# Patient Record
Sex: Female | Born: 1975 | Race: Black or African American | Hispanic: No | Marital: Single | Smoking: Never smoker
Health system: Southern US, Community
[De-identification: ages and names within clinical notes are randomized; demographics above are authoritative.]

## PROBLEM LIST (undated history)

## (undated) DIAGNOSIS — E559 Vitamin D deficiency, unspecified: Secondary | ICD-10-CM

## (undated) DIAGNOSIS — L509 Urticaria, unspecified: Secondary | ICD-10-CM

## (undated) DIAGNOSIS — Z5189 Encounter for other specified aftercare: Secondary | ICD-10-CM

## (undated) DIAGNOSIS — T7840XA Allergy, unspecified, initial encounter: Secondary | ICD-10-CM

## (undated) DIAGNOSIS — E669 Obesity, unspecified: Secondary | ICD-10-CM

## (undated) HISTORY — DX: Obesity, unspecified: E66.9

## (undated) HISTORY — DX: Vitamin D deficiency, unspecified: E55.9

## (undated) HISTORY — DX: Allergy, unspecified, initial encounter: T78.40XA

---

## 2001-07-06 ENCOUNTER — Ambulatory Visit (HOSPITAL_COMMUNITY): Admission: RE | Admit: 2001-07-06 | Discharge: 2001-07-06 | Payer: Self-pay | Admitting: *Deleted

## 2001-10-15 ENCOUNTER — Ambulatory Visit (HOSPITAL_COMMUNITY): Admission: RE | Admit: 2001-10-15 | Discharge: 2001-10-15 | Payer: Self-pay | Admitting: Obstetrics & Gynecology

## 2001-10-22 ENCOUNTER — Inpatient Hospital Stay (HOSPITAL_COMMUNITY): Admission: AD | Admit: 2001-10-22 | Discharge: 2001-10-22 | Payer: Self-pay | Admitting: *Deleted

## 2001-10-23 ENCOUNTER — Inpatient Hospital Stay (HOSPITAL_COMMUNITY): Admission: AD | Admit: 2001-10-23 | Discharge: 2001-10-23 | Payer: Self-pay | Admitting: *Deleted

## 2001-12-17 ENCOUNTER — Encounter (HOSPITAL_COMMUNITY): Admission: RE | Admit: 2001-12-17 | Discharge: 2001-12-25 | Payer: Self-pay | Admitting: Obstetrics and Gynecology

## 2001-12-27 ENCOUNTER — Encounter (INDEPENDENT_AMBULATORY_CARE_PROVIDER_SITE_OTHER): Payer: Self-pay | Admitting: Specialist

## 2001-12-27 ENCOUNTER — Inpatient Hospital Stay (HOSPITAL_COMMUNITY): Admission: AD | Admit: 2001-12-27 | Discharge: 2002-01-02 | Payer: Self-pay | Admitting: *Deleted

## 2002-01-06 ENCOUNTER — Inpatient Hospital Stay (HOSPITAL_COMMUNITY): Admission: AD | Admit: 2002-01-06 | Discharge: 2002-01-06 | Payer: Self-pay | Admitting: *Deleted

## 2002-01-17 ENCOUNTER — Inpatient Hospital Stay (HOSPITAL_COMMUNITY): Admission: AD | Admit: 2002-01-17 | Discharge: 2002-01-17 | Payer: Self-pay | Admitting: *Deleted

## 2004-01-08 ENCOUNTER — Emergency Department (HOSPITAL_COMMUNITY): Admission: EM | Admit: 2004-01-08 | Discharge: 2004-01-08 | Payer: Self-pay | Admitting: Emergency Medicine

## 2005-01-26 ENCOUNTER — Encounter: Admission: RE | Admit: 2005-01-26 | Discharge: 2005-01-26 | Payer: Self-pay | Admitting: *Deleted

## 2006-08-07 ENCOUNTER — Ambulatory Visit: Payer: Self-pay | Admitting: Internal Medicine

## 2006-08-28 ENCOUNTER — Ambulatory Visit: Payer: Self-pay | Admitting: Internal Medicine

## 2007-02-21 ENCOUNTER — Ambulatory Visit: Payer: Self-pay | Admitting: Family Medicine

## 2007-08-07 ENCOUNTER — Encounter (INDEPENDENT_AMBULATORY_CARE_PROVIDER_SITE_OTHER): Payer: Self-pay | Admitting: Family Medicine

## 2007-08-07 ENCOUNTER — Ambulatory Visit: Payer: Self-pay | Admitting: Nurse Practitioner

## 2007-08-07 DIAGNOSIS — N760 Acute vaginitis: Secondary | ICD-10-CM | POA: Insufficient documentation

## 2007-09-24 ENCOUNTER — Encounter (INDEPENDENT_AMBULATORY_CARE_PROVIDER_SITE_OTHER): Payer: Self-pay | Admitting: Family Medicine

## 2009-05-25 ENCOUNTER — Emergency Department (HOSPITAL_COMMUNITY): Admission: EM | Admit: 2009-05-25 | Discharge: 2009-05-25 | Payer: Self-pay | Admitting: Emergency Medicine

## 2009-07-22 ENCOUNTER — Ambulatory Visit (HOSPITAL_COMMUNITY): Admission: RE | Admit: 2009-07-22 | Discharge: 2009-07-22 | Payer: Self-pay | Admitting: Obstetrics & Gynecology

## 2009-09-15 ENCOUNTER — Ambulatory Visit (HOSPITAL_COMMUNITY): Admission: RE | Admit: 2009-09-15 | Discharge: 2009-09-15 | Payer: Self-pay | Admitting: Obstetrics and Gynecology

## 2009-10-13 ENCOUNTER — Ambulatory Visit (HOSPITAL_COMMUNITY): Admission: RE | Admit: 2009-10-13 | Discharge: 2009-10-13 | Payer: Self-pay | Admitting: Obstetrics and Gynecology

## 2009-11-10 ENCOUNTER — Ambulatory Visit (HOSPITAL_COMMUNITY): Admission: RE | Admit: 2009-11-10 | Discharge: 2009-11-10 | Payer: Self-pay | Admitting: Obstetrics and Gynecology

## 2009-11-14 ENCOUNTER — Inpatient Hospital Stay (HOSPITAL_COMMUNITY): Admission: AD | Admit: 2009-11-14 | Discharge: 2009-11-14 | Payer: Self-pay | Admitting: Obstetrics & Gynecology

## 2009-12-01 ENCOUNTER — Ambulatory Visit (HOSPITAL_COMMUNITY): Admission: RE | Admit: 2009-12-01 | Discharge: 2009-12-01 | Payer: Self-pay | Admitting: Obstetrics and Gynecology

## 2009-12-16 ENCOUNTER — Inpatient Hospital Stay (HOSPITAL_COMMUNITY): Admission: RE | Admit: 2009-12-16 | Discharge: 2009-12-18 | Payer: Self-pay | Admitting: Obstetrics and Gynecology

## 2010-03-18 IMAGING — US US OB FOLLOW-UP
1 series · 14 of 28 positions shown · non-contrast
Comparison: none

OBSTETRICAL ULTRASOUND:
 This ultrasound was performed in The [HOSPITAL], and the [facility] report will be stored to [REDACTED] PACS.  This report is also available in [HOSPITAL]?s accessANYware.

[Series 1: us ob follow-up · 14 of 37 slices shown]
[im 2/37]
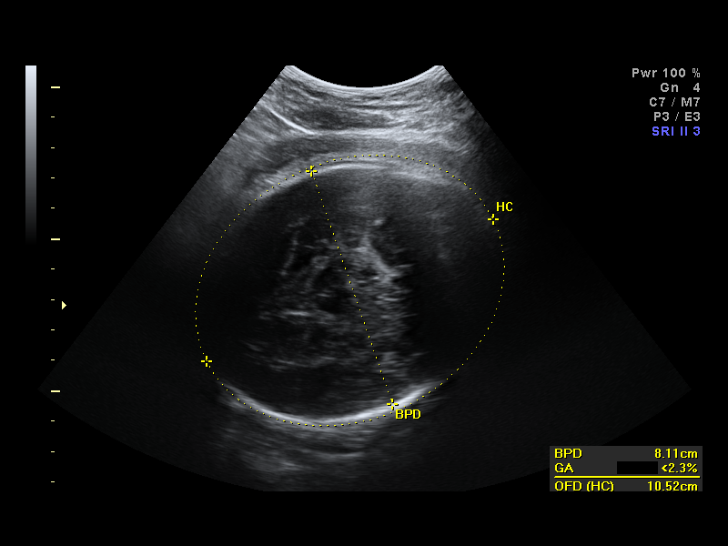
[im 5/37]
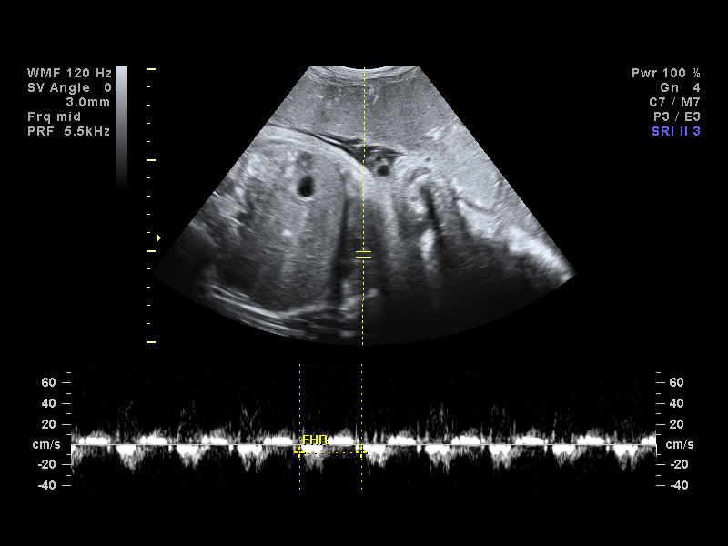
[im 7/37]
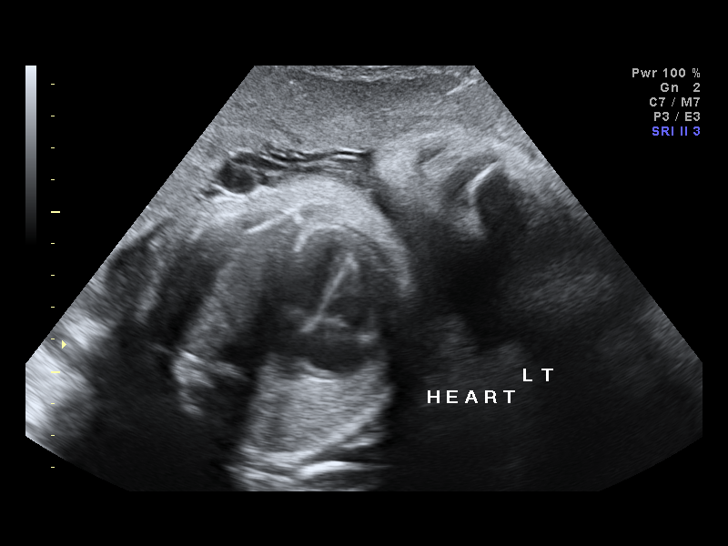
[im 10/37]
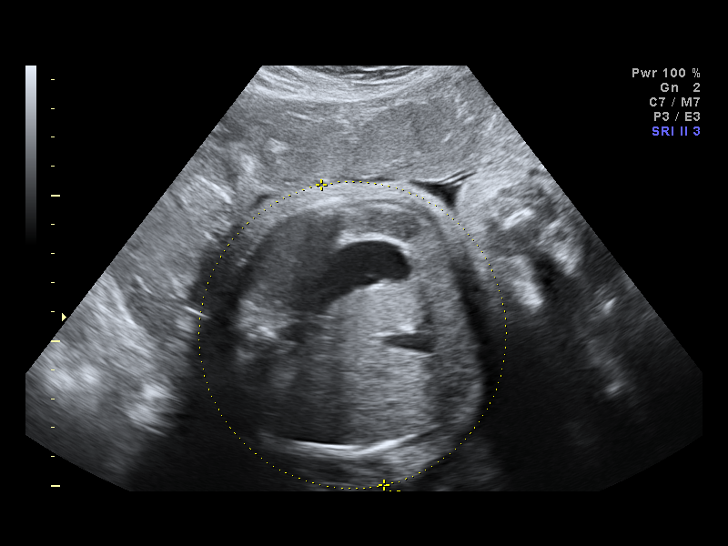
[im 13/37]
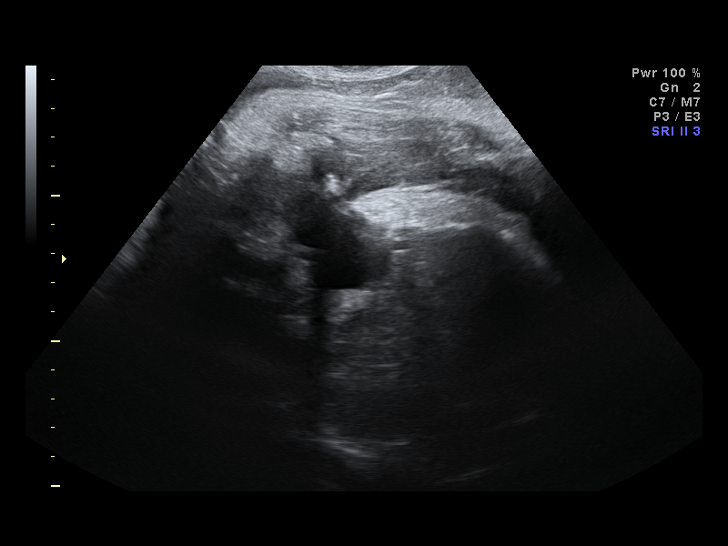
[im 15/37]
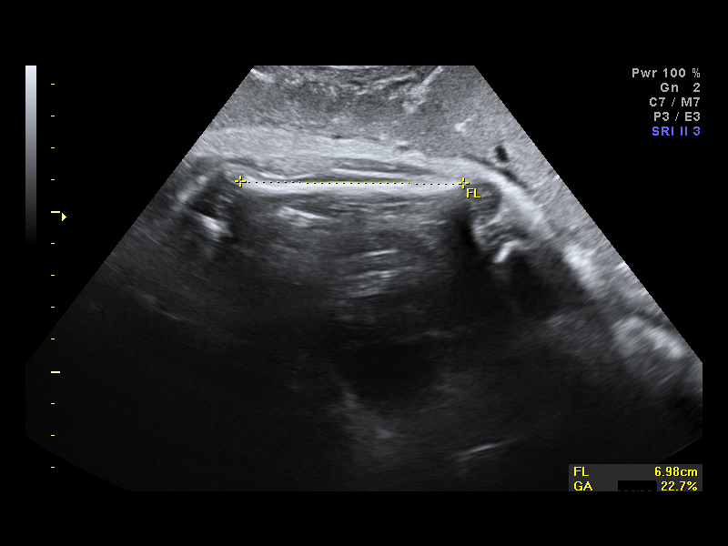
[im 18/37]
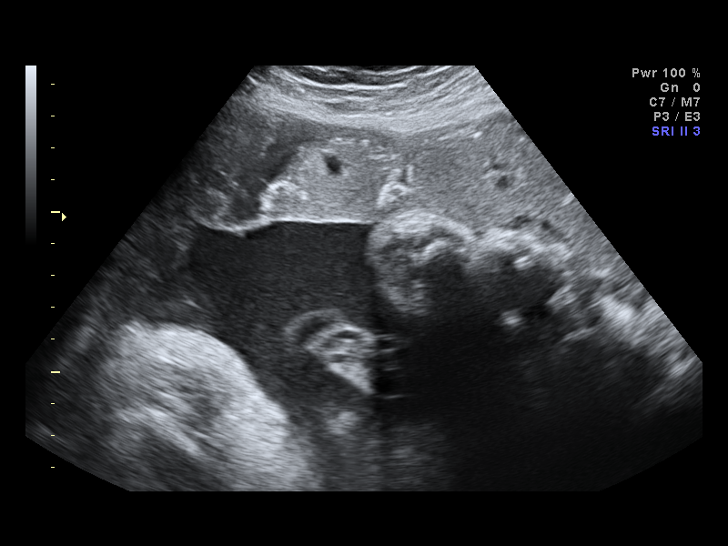
[im 21/37]
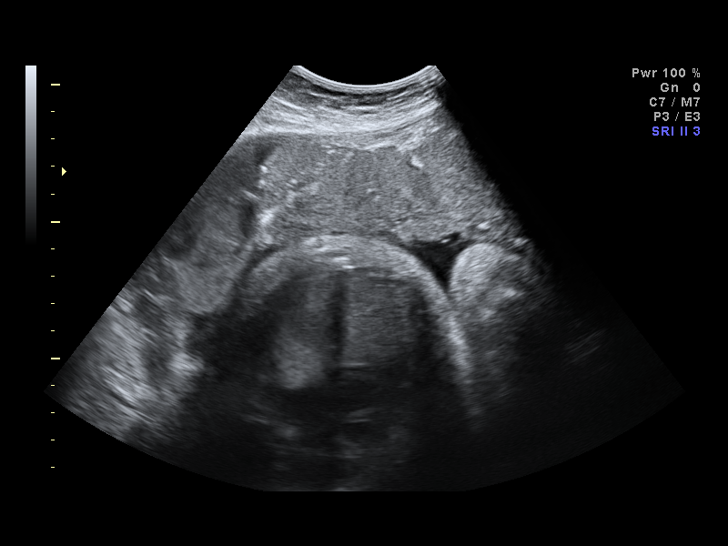
[im 23/37]
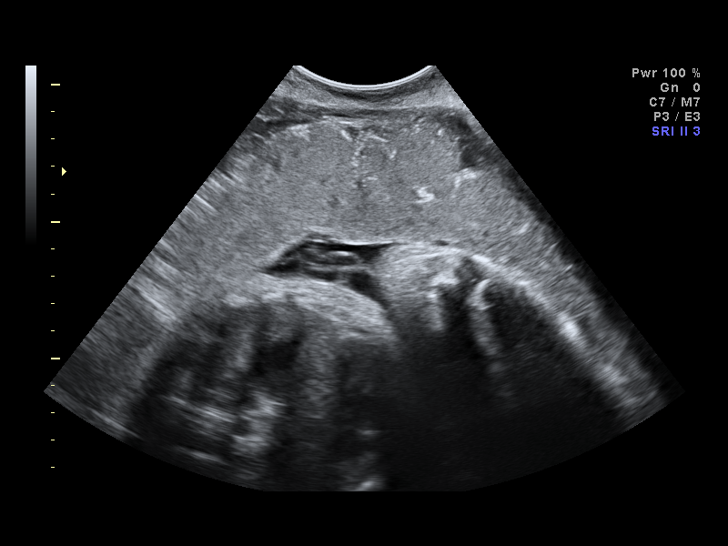
[im 26/37]
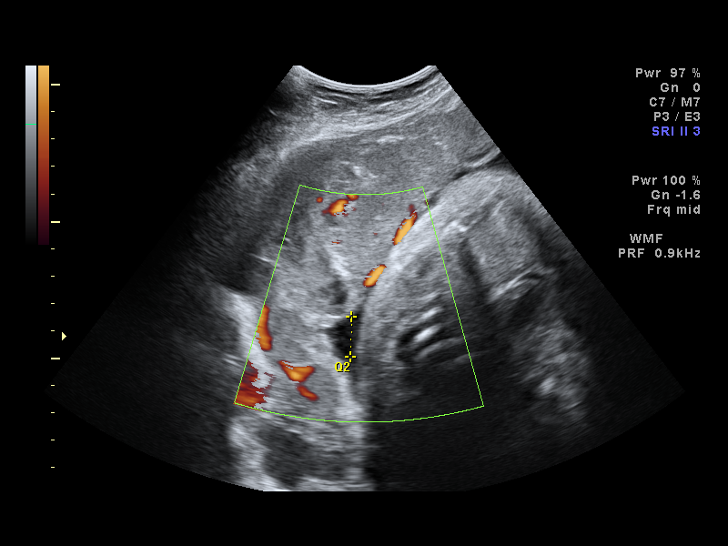
[im 29/37]
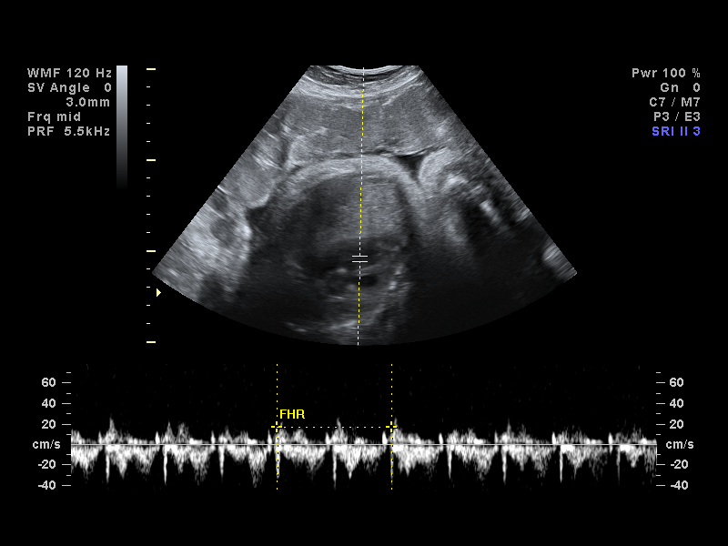
[im 31/37]
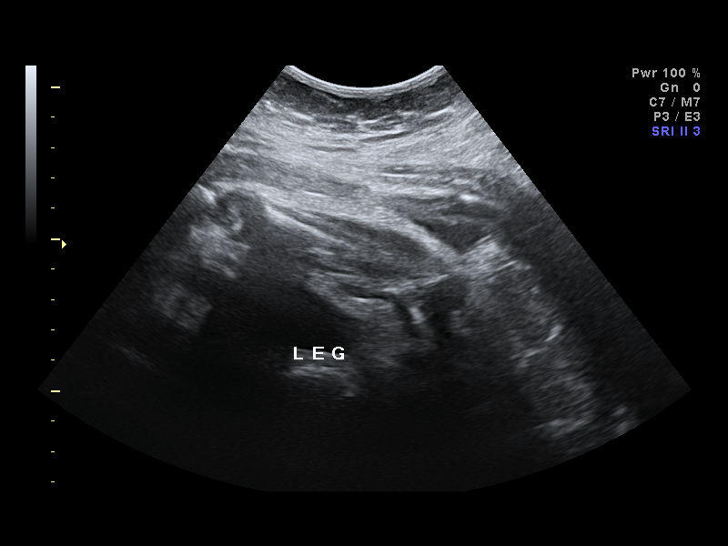
[im 34/37]
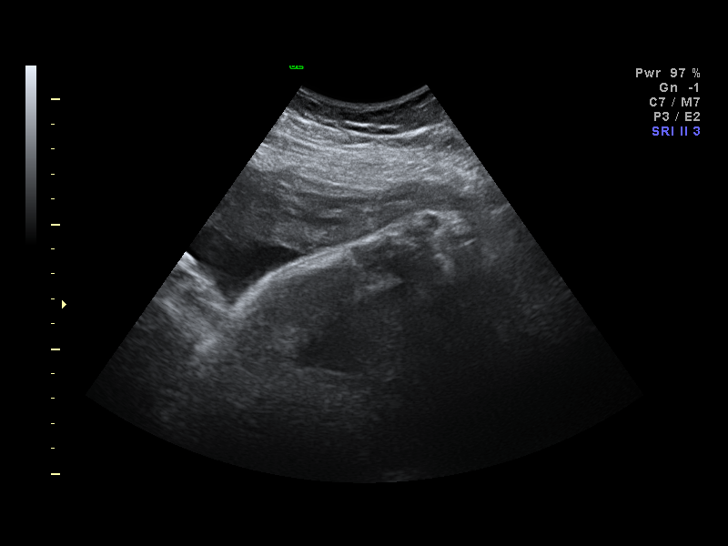
[im 37/37]
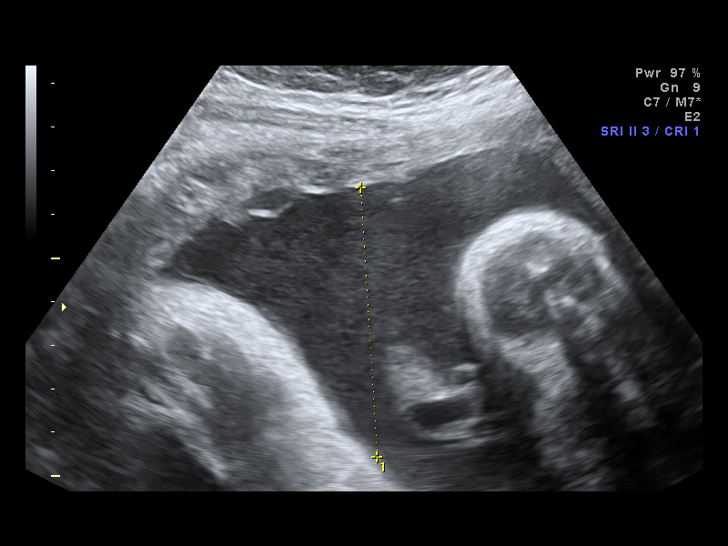

[14 of 28 positions shown; findings below may reference images not displayed]

IMPRESSION: [facility] has also been faxed to the ordering physician.

## 2010-10-17 ENCOUNTER — Encounter: Payer: Self-pay | Admitting: Obstetrics and Gynecology

## 2010-10-28 NOTE — Medication Information (Signed)
Summary: SCRIPT WROTE  SCRIPT WROTE   Imported By: Arta Bruce 09/24/2007 10:53:14  _____________________________________________________________________  External Attachment:    Type:   Image     Comment:   External Document

## 2010-12-19 LAB — CBC
HCT: 24.2 % — ABNORMAL LOW (ref 36.0–46.0)
HCT: 34 % — ABNORMAL LOW (ref 36.0–46.0)
Hemoglobin: 11.3 g/dL — ABNORMAL LOW (ref 12.0–15.0)
Hemoglobin: 8.1 g/dL — ABNORMAL LOW (ref 12.0–15.0)
MCHC: 33.2 g/dL (ref 30.0–36.0)
MCHC: 33.5 g/dL (ref 30.0–36.0)
MCV: 93.3 fL (ref 78.0–100.0)
MCV: 94.4 fL (ref 78.0–100.0)
Platelets: 172 10*3/uL (ref 150–400)
Platelets: 231 10*3/uL (ref 150–400)
RBC: 2.56 MIL/uL — ABNORMAL LOW (ref 3.87–5.11)
RBC: 3.65 MIL/uL — ABNORMAL LOW (ref 3.87–5.11)
RDW: 15.3 % (ref 11.5–15.5)
RDW: 15.4 % (ref 11.5–15.5)
WBC: 6.1 10*3/uL (ref 4.0–10.5)
WBC: 7.9 10*3/uL (ref 4.0–10.5)

## 2010-12-19 LAB — RPR: RPR Ser Ql: NONREACTIVE

## 2011-02-11 NOTE — Discharge Summary (Signed)
[facility]  Patient:    Valerie Rivera, Valerie Rivera Visit Number: [id number] MRN: [id number]          Service Type: OBS Location: 910A 9146 01 Attending Physician:  Enid Cutter Dictated by:   Ed Blalock. Burnadette Peter, M.D. Admit Date:  12/27/2001 Discharge Date: 01/02/2002   CC:         GYN Clinic  [facility]   Discharge Summary  DATE OF BIRTH:  1976-01-13  HISTORY OF PRESENT ILLNESS:  This 35 year old, G1, presented at 41-6/7 weeks by last menstrual period confirmed by 17 week ultrasound for induction of labor for post-dates.  On presentation the patient was having some mild contractions, no vaginal bleeding, membranes were intact, and good fetal movement.  Prenatal care was at [facility].  She had a history of BV, and a positive drug screen for marijuana during the pregnancy, as well as chlamydia.  MEDICATIONS:  None.  ALLERGIES:  No known drug allergies.  PAST OBSTETRICAL HISTORY:  She is a primigravida.  PAST GYNECOLOGICAL HISTORY:  History of positive chlamydia test and BV.  PAST MEDICAL HISTORY:  She had a history of cystitis x2 in 1999 and 2001.  PAST SURGICAL HISTORY:  Denies any.  FAMILY HISTORY:  The patient is adopted and with limited knowledge of her biological parents.  SOCIAL HISTORY:  Denies current tobacco or illicit drug use or alcohol use.  PRENATAL LABORATORY DATA:  Normal.  PHYSICAL EXAMINATION:  VITAL SIGNS:  Stable at presentation.  GENERAL:  In no acute distress.  HEART:  Regular rate and rhythm, no murmur.  LUNGS:  Clear to auscultation bilaterally.  ABDOMEN:  Soft and gravid.  EXTREMITIES:  Edema 1+.  NEUROLOGIC:  Deep tendon reflexes 2+, no clonus.  PELVIC:  Speculum examination on presentation was not done.  Digital cervical examination revealed cervix 3 cm, 80%, and -1.  Intact membranes, and vertex presentation.  Fetal heart rate was 130 to 145 baseline, good variability and reactive, no  decelerations.  She was contracting every 5 minutes mildly.  HOSPITAL COURSE:  The patient was admitted to labor and delivery and underwent induction of labor with Pitocin for her post-dates.  She had an epidural placed.  The patient made little progress, and therefore her Pitocin was increased after artificial rupture of membranes was undertaken.  The Pitocin reached a max dose of 30 ______.  The patient after being on Pitocin overnight with IUPC being placed and monitoring of her daily units that were at times inadequate and at times adequate, the patient reached maximum dilation of 7 cm, and was taken for a cesarean section for failed induction secondary to failure to progress.  During cesarean section, the patient was delivered of a viable female infant, Apgars 8 and 9 at one and five minutes with a weight of 8 pounds 2 ounces, and a cord pH of 7.38.  The patient also had postpartum hemorrhage due to severe uterine atony requiring Hemabate x3 with two of those being intramuscularly and one being in the myometrium, Pitocin 10 units x2 in the myometrium, and Methergine 0.2 x2 intramuscularly.  Her bleeding stabilized after this.  She required immediate transfusion of 2 units of packed red blood cells and a subsequent transfusion postoperatively of 1 unit of packed red blood cells.  The patient also developed ______ result of her atony and hemorrhage DIC, requiring transfusion of 2 units of packed red blood cells.  Her labs were followed serially, and her hemoglobin as well as  her DIC resolved.  She was discharged home postoperative day #4 without any further complications.  During her hospitalization her maximum hemoglobin was 12.1, at discharge it was 7.7.  Her maximum INR was 1.7, normalizing at 1.2. Her fibrinogen at its lowest was 232.  On 12/30/01, it was 550, back in the high-normal range which is normal for pregnancy.  Her D-dimer at the highest was greater than 20, decreasing to 7.58  at last check.  She also had elevated liver function tests, AST of 53, ALT of 24, which normalized.  Due to her large blood loss and a few skipped beats on the monitor, the CK was elevated, the MB and index were normal.  The troponin was equivocal, and the EKG was negative.  Troponin was repeated prior to discharge and was negative.  Her EKG was normal.  ACTIVITY:  Ad lib and pelvic rest.  DIET:  Regular.  DISCHARGE MEDICATIONS: 1. Percocet 5/325 mg one p.o. q.4h. p.r.n. 2. Motrin 800 mg one p.o. q.8h. p.r.n. 3. Iron 325 mg one p.o. b.i.d. 4. Colace 100 mg p.o. b.i.d. 5. Prenatal vitamins one p.o. q.d.  FOLLOWUP: 1. Return to Maternity Admissions in 3 days for staple removal. 2. Return to GYN Clinic in two weeks for followup on hemoglobin and D-dimer. 3. Return to [facility] in six weeks for routine followup.  CONDITION ON DISCHARGE:  Improved and delivered.  DISCHARGE DIAGNOSES: 1. Intrauterine pregnancy at post-dates. 2. Induction of labor. 3. Failed induction secondary to failure to progress, maximum dilation 7 cm. 4. Status post low transverse cesarean section. 5. Postpartum hemorrhage secondary to uterine atony. 6. Desceminated intravascular coagulopathy (DIC), resolved. 7. Anemia secondary to postpartum hemorrhage. Dictated by:   Ed Blalock. Burnadette Peter, M.D. Attending Physician:  Enid Cutter DD:  01/02/02 TD:  01/03/02 Job: [id number]

## 2011-02-11 NOTE — Op Note (Signed)
[facility]  Patient:    Valerie Rivera, Valerie Rivera Visit Number: [id number] MRN: [id number]          Service Type: OBS Location: 9400 [id number] 01 Attending Physician:  Enid Cutter Dictated by:   Ed Blalock. Burnadette Peter, M.D. Proc. Date: 12/29/01 Admit Date:  12/27/2001                             Operative Report  DATE OF BIRTH:                [birth year]  PREOPERATIVE DIAGNOSES:       1. Intrauterine pregnancy at 34 and 2.                               2. Induction of labor secondary to post dates.                               3. Failed induction secondary to failure to                                  progress.  POSTOPERATIVE DIAGNOSES:      1. Intrauterine pregnancy at 34 and 2.                               2. Induction of labor secondary to post dates.                               3. Failed induction secondary to failure to                                  progress.                               4. Uterine atone intraoperatively.  PROCEDURE:                    Primary low transverse cesarean section.  SURGEON:                      Conni Elliot, M.D.  ASSISTANT:                    Ed Blalock. Burnadette Peter, M.D.  ESTIMATED BLOOD LOSS:         3300 cc.  FLUIDS:                       4200 cc lactated Ringers.  URINE OUTPUT:                 900 cc.  INDICATIONS:                  A 35 year old G1 at 34 and 2 who underwent induction of labor secondary to post dates with the subsequence of failure to progress with a maximum dilation of 7 cm overnight.  FINDINGS:  Female infant.  Weight 8 pounds 2 ounces.  pH 7.38.  Apgars 8 and 9.  COMPLICATIONS:                Uterine atony and postpartum hemorrhage of approximately 3300 L.  The atony required Hemabate x2 intramuscularly, x1 in the myometrium, Pitocin 10 units x2 in the myometrium, and Methergine 0.2 mg x2 intramuscularly.  PROCEDURE:                    Patient was taken to the  operating room where epidural anesthesia was found to be adequate.  She was prepped and draped in the normal sterile fashion in the dorsal supine position.  Pfannenstiel incision was then made with the scalpel and carried through to the underlying layer of fascia sharply.  The fascia was incised in the midline.  The incision was extended laterally with Mayo scissors.  Superior aspect of the fascial incision was then grasped with the Kocher clamps, elevated and the underlying rectus muscle dissected off bluntly.  Attention was then turned to the inferior aspect of the incision which in a similar fashion was grasped, tented up with the clamps, and the rectus muscles dissected off bluntly.  The rectus muscles were then separated in the midline.  Peritoneum identified, tented up, and entered sharply with Metzenbaum scissors.  The peritoneal incision was extended superiorly and inferiorly with good visualization of the bladder. Bladder blade was then inserted and the vesicouterine peritoneum was identified, grasped with pickups, and entered sharply with Metzenbaum scissors.  This incision was then extended laterally with Metzenbaum and the bladder flap was created digitally.  The bladder blade was then reinserted and the lower uterine segment was incised in a transverse fashion with the scalpel.  The uterine incision was then extended laterally with stretching. Bladder blade was moved and the patients head delivered atraumatically.  The nose and mouth were suctioned with the bulb suction and the cord clamped and cut.  The infant was handed off to the awaiting pediatrician.  Cord gases were sent.  Results as above.  The placenta was then removed by expressing it.  The uterus exteriorized and cleared of all clots and debris.  The uterine incision was repaired with 1-0 chromic in a running locked fashion.  At this time atony of the uterus was noted and the interventions as noted above were  performed.  In addition, the patient received fluid boluses and Ephedra intramuscularly.  Patient was also typed and crossed for 2 units of packed red blood cells stat and transfusion begun in the operating room.  After these interventions it was felt that the uterus was firming up.  A second layer of suture was used to obtain good hemostasis.  Bladder flap was repaired with 3-0 Vicryl in a running stitch. Uterus returned to the abdomen.  The gutter was cleared of clots.  The peritoneum was closed with 0 Vicryl.  The fascia was reapproximated with 0 Vicryl in a running fashion.  Skin was closed with staples.  Prior to removal from the operating room the patient had increased vaginal bleeding.  The uterus was explored from below removing several large clots from the lower uterine segment.  The fundus was firm.  The uterine segment subsequently became firm.  Her bleeding decreased.  The patient tolerated the procedure well and remained alert and mentating. Sponge, lap, and needle counts were correct x2.  Patient was continued on Unasyn postoperatively.  She was taken to the recovery  room in stable condition.  In the recovery room a DIC panel was ordered.  Hemabate 0.25 mg standard dose was ordered again as well as Methergine 0.2 mg IM q.4h. and a post transfusion H&H. Dictated by:   Ed Blalock. Burnadette Peter, M.D. Attending Physician:  Enid Cutter DD:  12/29/01 TD:  12/30/01 Job: [id number]

## 2011-09-28 ENCOUNTER — Encounter: Payer: Self-pay | Admitting: *Deleted

## 2011-09-28 ENCOUNTER — Emergency Department (HOSPITAL_COMMUNITY)
Admission: EM | Admit: 2011-09-28 | Discharge: 2011-09-28 | Disposition: A | Discharge status: Home / Self Care | Payer: No Typology Code available for payment source | Attending: Emergency Medicine | Admitting: Emergency Medicine

## 2011-09-28 DIAGNOSIS — T148XXA Other injury of unspecified body region, initial encounter: Secondary | ICD-10-CM | POA: Insufficient documentation

## 2011-09-28 DIAGNOSIS — M545 Low back pain, unspecified: Secondary | ICD-10-CM | POA: Insufficient documentation

## 2011-09-28 DIAGNOSIS — Y92410 Unspecified street and highway as the place of occurrence of the external cause: Secondary | ICD-10-CM | POA: Insufficient documentation

## 2011-09-28 MED ORDER — METHOCARBAMOL 500 MG PO TABS: ORAL_TABLET | ORAL | Status: AC

## 2011-09-28 MED ORDER — IBUPROFEN 600 MG PO TABS: 600.0000 mg | ORAL_TABLET | Freq: Four times a day (QID) | ORAL | Status: AC | PRN

## 2011-09-28 MED ORDER — IBUPROFEN 200 MG PO TABS
600.0000 mg | ORAL_TABLET | Freq: Once | ORAL | Status: AC
  Administered 2011-09-28: 600 mg via ORAL
  Filled 2011-09-28: qty 3
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
## 2011-09-28 NOTE — ED Notes (Signed)
MD @ bedside to eval

## 2011-09-28 NOTE — ED Notes (Signed)
Bed:WA16<BR> Expected date:<BR> Expected time:<BR> Means of arrival:<BR> Comments:<BR> Hold

## 2011-10-20 ENCOUNTER — Ambulatory Visit: Payer: No Typology Code available for payment source | Admitting: Rehabilitative and Restorative Service Providers"

## 2011-10-21 ENCOUNTER — Ambulatory Visit: Payer: No Typology Code available for payment source | Admitting: Rehabilitative and Restorative Service Providers"

## 2011-10-25 ENCOUNTER — Ambulatory Visit: Payer: No Typology Code available for payment source | Attending: Orthopedic Surgery | Admitting: Physical Therapy

## 2011-10-25 DIAGNOSIS — M545 Low back pain, unspecified: Secondary | ICD-10-CM | POA: Insufficient documentation

## 2011-10-25 DIAGNOSIS — M546 Pain in thoracic spine: Secondary | ICD-10-CM | POA: Insufficient documentation

## 2011-10-25 DIAGNOSIS — IMO0001 Reserved for inherently not codable concepts without codable children: Secondary | ICD-10-CM | POA: Insufficient documentation

## 2011-11-01 ENCOUNTER — Ambulatory Visit: Payer: No Typology Code available for payment source | Attending: Orthopedic Surgery | Admitting: Physical Therapy

## 2011-11-01 DIAGNOSIS — IMO0001 Reserved for inherently not codable concepts without codable children: Secondary | ICD-10-CM | POA: Insufficient documentation

## 2011-11-01 DIAGNOSIS — M546 Pain in thoracic spine: Secondary | ICD-10-CM | POA: Insufficient documentation

## 2011-11-01 DIAGNOSIS — M545 Low back pain, unspecified: Secondary | ICD-10-CM | POA: Insufficient documentation

## 2011-11-08 ENCOUNTER — Ambulatory Visit: Payer: No Typology Code available for payment source | Admitting: Physical Therapy

## 2011-11-15 ENCOUNTER — Encounter: Payer: No Typology Code available for payment source | Admitting: Physical Therapy

## 2013-10-30 ENCOUNTER — Encounter (HOSPITAL_COMMUNITY): Payer: Self-pay | Admitting: Emergency Medicine

## 2013-10-30 ENCOUNTER — Emergency Department (HOSPITAL_COMMUNITY): Payer: Self-pay

## 2013-10-30 ENCOUNTER — Emergency Department (HOSPITAL_COMMUNITY)
Admission: EM | Admit: 2013-10-30 | Discharge: 2013-10-30 | Disposition: A | Discharge status: Home / Self Care | Payer: Self-pay | Attending: Emergency Medicine | Admitting: Emergency Medicine

## 2013-10-30 DIAGNOSIS — R059 Cough, unspecified: Secondary | ICD-10-CM | POA: Insufficient documentation

## 2013-10-30 DIAGNOSIS — J029 Acute pharyngitis, unspecified: Secondary | ICD-10-CM | POA: Insufficient documentation

## 2013-10-30 DIAGNOSIS — R079 Chest pain, unspecified: Secondary | ICD-10-CM | POA: Insufficient documentation

## 2013-10-30 DIAGNOSIS — M549 Dorsalgia, unspecified: Secondary | ICD-10-CM | POA: Insufficient documentation

## 2013-10-30 DIAGNOSIS — R05 Cough: Secondary | ICD-10-CM | POA: Insufficient documentation

## 2013-10-30 LAB — BASIC METABOLIC PANEL
BUN: 10 mg/dL (ref 6–23)
CO2: 25 mEq/L (ref 19–32)
Calcium: 8.9 mg/dL (ref 8.4–10.5)
Chloride: 99 mEq/L (ref 96–112)
Creatinine, Ser: 0.7 mg/dL (ref 0.50–1.10)
GFR calc Af Amer: >90 mL/min (ref >90)
GFR calc non Af Amer: >90 mL/min (ref >90)
Glucose, Bld: 82 mg/dL (ref 70–99)
Potassium: 3.7 mEq/L (ref 3.7–5.3)
Sodium: 135 mEq/L — ABNORMAL LOW (ref 137–147)

## 2013-10-30 LAB — CBC WITH DIFFERENTIAL/PLATELET
Basophils Absolute: 0 10*3/uL (ref 0.0–0.1)
Basophils Relative: 0 % (ref 0–1)
Eosinophils Absolute: 0.1 10*3/uL (ref 0.0–0.7)
Eosinophils Relative: 2 % (ref 0–5)
HCT: 37 % (ref 36.0–46.0)
Hemoglobin: 12.5 g/dL (ref 12.0–15.0)
Lymphocytes Relative: 38 % (ref 12–46)
Lymphs Abs: 3 10*3/uL (ref 0.7–4.0)
MCH: 30.3 pg (ref 26.0–34.0)
MCHC: 33.8 g/dL (ref 30.0–36.0)
MCV: 89.8 fL (ref 78.0–100.0)
Monocytes Absolute: 0.5 10*3/uL (ref 0.1–1.0)
Monocytes Relative: 6 % (ref 3–12)
Neutro Abs: 4.4 10*3/uL (ref 1.7–7.7)
Neutrophils Relative %: 55 % (ref 43–77)
Platelets: 264 10*3/uL (ref 150–400)
RBC: 4.12 MIL/uL (ref 3.87–5.11)
RDW: 13 % (ref 11.5–15.5)
WBC: 8.1 10*3/uL (ref 4.0–10.5)

## 2013-10-30 LAB — POCT I-STAT TROPONIN I: Troponin i, poc: 0 ng/mL (ref 0.00–0.08)

## 2013-10-30 LAB — RAPID STREP SCREEN (MED CTR MEBANE ONLY): Streptococcus, Group A Screen (Direct): NEGATIVE

## 2013-10-30 MED ORDER — METHOCARBAMOL 500 MG PO TABS
500.0000 mg | ORAL_TABLET | Freq: Once | ORAL | Status: AC
  Administered 2013-10-30: 500 mg via ORAL
  Filled 2013-10-30: qty 1

## 2013-10-30 MED ORDER — OXYCODONE-ACETAMINOPHEN 5-325 MG PO TABS: 1.0000 | ORAL_TABLET | ORAL | Status: DC | PRN

## 2013-10-30 MED ORDER — OXYCODONE-ACETAMINOPHEN 5-325 MG PO TABS
1.0000 | ORAL_TABLET | Freq: Once | ORAL | Status: AC
  Administered 2013-10-30: 1 via ORAL
  Filled 2013-10-30: qty 1

## 2013-10-30 MED ORDER — METHOCARBAMOL 500 MG PO TABS: 500.0000 mg | ORAL_TABLET | Freq: Two times a day (BID) | ORAL | Status: DC | PRN

## 2013-10-30 NOTE — ED Notes (Addendum)
Pt report that she became lightheaded and dizzy on Saturday, began to have a sore throat on Saturday that radiates to her back. Pt states she has had a dry cough that causes her CP. Pt states that she feels like she "needs to be treated for strep, flu and pneumonia." Pt states she took 200mg  Ibuprofen and "half a muscle relaxer" that has not relieved her pain. Pt a&o x4, ambulatory to triage.
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
## 2013-10-30 NOTE — ED Provider Notes (Signed)
Medical screening examination/treatment/procedure(s) were performed by non-physician practitioner and as supervising physician I was immediately available for consultation/collaboration.  EKG Interpretation    Date/Time:  Wednesday October 30 2013 21:20:51 EST Ventricular Rate:  72 PR Interval:  172 QRS Duration: 71 QT Interval:  391 QTC Calculation: 428 R Axis:   67 Text Interpretation:  Sinus rhythm Baseline wander in lead(s) V2 Confirmed by Gwendolyn GrantWALDEN  MD, Othniel Maret ([id number]) on 10/30/2013 10:18:23 PM              Dagmar HaitWilliam Jashayla Glatfelter, MD 10/30/13 [phone]

## 2013-10-30 NOTE — ED Provider Notes (Signed)
CSN: [id number]     Arrival date & time 10/30/13  2025 History   First MD Initiated Contact with Patient 10/30/13 2037     Chief Complaint  Patient presents with  . Sore Throat  . Back Pain   (Consider location/radiation/quality/duration/timing/severity/associated sxs/prior Treatment) Patient is a 38 y.o. female presenting with pharyngitis and back pain. The history is provided by the patient and medical records.  Sore Throat Associated symptoms include chest pain, coughing and a sore throat.  Back Pain Associated symptoms: chest pain    This is a 38 year old female with no significant past medical history presenting to the ED with multiple complaints. Patient states on Saturday afternoon after cleaning her house for 5 hours she became lightheaded and dizzy which resolved after lying down and taking a nap and has not recurred. Later on that afternoon she developed a sore throat and some right-sided back pain.  Pt states she has a dry cough that causes some right sided chest pain, worse with movement.  No palpitations, SOB, diaphoresis, numbness/paresthesias of extremities, nausea, or vomiting.  No fevers, sweats, or chills.  Pt states she took half a flexeril last night and motrin without improvement.  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  No recent travel, LE edema, calf pain, surgeries, or periods of prolonged immobilization.  VS stable on arrival.  History reviewed. No pertinent past medical history. History reviewed. No pertinent past surgical history. History reviewed. No pertinent family history. History  Substance Use Topics  . Smoking status: Never Smoker   . Smokeless tobacco: Not on file  . Alcohol Use: Yes   OB History   Grav Para Term Preterm Abortions TAB SAB Ect Mult Living                 Review of Systems  HENT: Positive for sore  throat.   Respiratory: Positive for cough.   Cardiovascular: Positive for chest pain.  Musculoskeletal: Positive for back pain.  All other systems reviewed and are negative.    Allergies  Review of patient's allergies indicates no known allergies.  Home Medications   Current Outpatient Rx  Name  Route  Sig  Dispense  Refill  . ibuprofen (ADVIL,MOTRIN) 200 MG tablet   Oral   Take 200 mg by mouth every 6 (six) hours as needed for moderate pain.         Marland Kitchen PRESCRIPTION MEDICATION   Oral   Take 0.5 tablets by mouth once.          BP 116/79  Pulse 79  Temp(Src) 98.2 F (36.8 C) (Oral)  Resp 16  Ht 5\' 3"  (1.6 m)  Wt 185 lb (83.915 kg)  BMI 32.78 kg/m2  SpO2 99%  LMP 10/07/2013  Physical Exam  Nursing note and vitals reviewed. Constitutional: She is oriented to person, place, and time. She appears well-developed and well-nourished. No distress.  HENT:  Head: Normocephalic and atraumatic.  Right Ear: Tympanic membrane and ear canal normal.  Left Ear: Tympanic membrane and ear canal normal.  Nose: Nose normal.  Mouth/Throat: Uvula is midline, oropharynx is clear and moist and mucous membranes are normal. No oropharyngeal exudate, posterior oropharyngeal edema, posterior oropharyngeal erythema or tonsillar abscesses.  Tonsils normal in appearance bilaterally without exudate, uvula midline, no peritonsillar abscess, handling secretions appropriately, no difficulty swallowing or speaking  Eyes: Conjunctivae and EOM  are normal. Pupils are equal, round, and reactive to light.  Neck: Normal range of motion. Neck supple.  Cardiovascular: Normal rate, regular rhythm and normal heart sounds.   Pulmonary/Chest: Effort normal and breath sounds normal. No respiratory distress. She has no wheezes.  Abdominal: Soft. Bowel sounds are normal. There is no tenderness. There is no guarding.  Musculoskeletal: Normal range of motion. She exhibits no edema.       Thoracic back: She exhibits  tenderness, bony tenderness and pain.       Back:  TTP of right paraspinal region; no midline tenderness or step-off; full ROM maintained; sensation of all 4 extremities intact diffusely  Neurological: She is alert and oriented to person, place, and time.  Skin: Skin is warm and dry. She is not diaphoretic.  Psychiatric: She has a normal mood and affect.    ED Course  Procedures (including critical care time) Labs Review Labs Reviewed  BASIC METABOLIC PANEL - Abnormal; Notable for the following:    Sodium 135 (*)    All other components within normal limits  RAPID STREP SCREEN  CULTURE, GROUP A STREP  CBC WITH DIFFERENTIAL  POCT I-STAT TROPONIN I   Imaging Review Dg Chest 2 View  10/30/2013   CLINICAL DATA:  Chest pain  EXAM: CHEST  2 VIEW  COMPARISON:  None.  FINDINGS: Lungs are clear. Heart size and pulmonary vascularity are normal. No pneumothorax. No adenopathy. No bone lesions.  IMPRESSION: No abnormality noted.   Electronically Signed   By: Bretta BangWilliam  Woodruff M.D.   On: 10/30/2013 21:50    EKG Interpretation    Date/Time:  Wednesday October 30 2013 21:20:51 EST Ventricular Rate:  72 PR Interval:  172 QRS Duration: 71 QT Interval:  391 QTC Calculation: 428 R Axis:   67 Text Interpretation:  Sinus rhythm Baseline wander in lead(s) V2 Confirmed by Gwendolyn GrantWALDEN  MD, BLAIR ([id number]) on 10/30/2013 10:18:23 PM            MDM   1. Back pain   2. Sore throat    EKG normal sinus rhythm, no acute ischemic changes. Troponins negative. Chest x-ray is clear. Labs are reassuring. Rapid strep negative, culture pending.  Patient's pain is reproducible with palpation, at this time low suspicion for ACS, PT, dissection, or other acute cardiac event--sx likely MSK in nature.   Pt given robaxin and percocet with complete resolution of sx.  She will be discharged with the same.  [facility].  Discussed plan with pt, she acknowledged understanding and  agreed with plan of care.  Strict return precautions advised for new or worsening symptoms.  Garlon HatchetLisa M Loyal Holzheimer, PA-C 10/30/13 [phone]

## 2013-10-31 ENCOUNTER — Other Ambulatory Visit: Payer: Self-pay

## 2013-11-02 LAB — CULTURE, GROUP A STREP

## 2017-06-23 ENCOUNTER — Encounter (HOSPITAL_COMMUNITY): Payer: Self-pay

## 2017-07-06 ENCOUNTER — Other Ambulatory Visit: Payer: Self-pay

## 2017-07-06 DIAGNOSIS — Z1231 Encounter for screening mammogram for malignant neoplasm of breast: Secondary | ICD-10-CM

## 2018-04-24 ENCOUNTER — Other Ambulatory Visit: Payer: Self-pay

## 2018-04-24 DIAGNOSIS — Z1231 Encounter for screening mammogram for malignant neoplasm of breast: Secondary | ICD-10-CM

## 2018-04-27 ENCOUNTER — Telehealth (HOSPITAL_COMMUNITY): Payer: Self-pay

## 2018-04-27 NOTE — Telephone Encounter (Signed)
Called patient home number left a message to return call to [facility]

## 2018-05-29 ENCOUNTER — Other Ambulatory Visit: Payer: Self-pay | Admitting: Obstetrics and Gynecology

## 2018-05-29 DIAGNOSIS — Z1231 Encounter for screening mammogram for malignant neoplasm of breast: Secondary | ICD-10-CM

## 2018-08-02 ENCOUNTER — Ambulatory Visit
Admission: RE | Admit: 2018-08-02 | Discharge: 2018-08-02 | Disposition: A | Discharge status: Home / Self Care | Payer: No Typology Code available for payment source | Source: Ambulatory Visit | Attending: Obstetrics and Gynecology | Admitting: Obstetrics and Gynecology

## 2018-08-02 ENCOUNTER — Encounter (HOSPITAL_COMMUNITY): Payer: Self-pay

## 2018-08-02 ENCOUNTER — Ambulatory Visit (HOSPITAL_COMMUNITY)
Admission: RE | Admit: 2018-08-02 | Discharge: 2018-08-02 | Disposition: A | Discharge status: Home / Self Care | Payer: Self-pay | Source: Ambulatory Visit | Attending: Obstetrics and Gynecology | Admitting: Obstetrics and Gynecology

## 2018-08-02 VITALS — BP 124/82 | Ht 63.0 in | Wt 186.0 lb

## 2018-08-02 DIAGNOSIS — Z1239 Encounter for other screening for malignant neoplasm of breast: Secondary | ICD-10-CM

## 2018-08-02 DIAGNOSIS — Z1231 Encounter for screening mammogram for malignant neoplasm of breast: Secondary | ICD-10-CM

## 2018-08-02 HISTORY — DX: Encounter for other specified aftercare: Z51.89

## 2018-08-02 HISTORY — DX: Urticaria, unspecified: L50.9

## 2018-08-02 NOTE — Patient Instructions (Signed)
Explained breast self awareness with Robina D Fassnacht. Patient did not need a Pap smear today due to last Pap smear was in September 2018 per patient. Let her know [facility] will cover Pap smears every 3 years unless has a history of abnormal Pap smears. Referred [facility] to [facility] for a screening mammogram. Appointment scheduled for Thursday, August 02, 2018 at 1110. Patient aware of appointment and will be there. Let patient know the [facility] will follow up with her within the next couple weeks with results of mammogram by letter or phone. Ruthann D Cutillo verbalized understanding.  Amir Glaus, Kathaleen Maser, RN 9:08 AM

## 2018-08-02 NOTE — Progress Notes (Signed)
No complaints today.   Pap Smear: Pap smear not completed today. Last Pap smear was in September 2018 at the [facility] and normal per patient. Per patient has no history of an abnormal Pap smear. No Pap smear results are in Epic.  Physical exam: Breasts Breasts symmetrical. No skin abnormalities bilateral breasts. No nipple retraction bilateral breasts. No nipple discharge bilateral breasts. No lymphadenopathy. No lumps palpated bilateral breasts. No complaints of pain or tenderness on exam. Referred patient to the [facility] for a screening mammogram. Appointment scheduled for Thursday, August 02, 2018 at 1110.        Pelvic/Bimanual No Pap smear completed today since last Pap smear was in September 2018 per patient. Pap smear not indicated per BCCCP guidelines.   Smoking History: Patient has never smoked.  Patient Navigation: Patient education provided. Access to services provided for patient through BCCCP program.   Breast and Cervical Cancer Risk Assessment: Patient has no family history of breast cancer, known genetic mutations, or radiation treatment to the chest before age 52. Patient has no history of cervical dysplasia, immunocompromised, or DES exposure in-utero.  Risk Assessment    Risk Scores      08/02/2018   Last edited by: Lynnell Dike, LPN   5-year risk: 0.6 %   Lifetime risk: 9.6 %

## 2018-08-03 ENCOUNTER — Encounter (HOSPITAL_COMMUNITY): Payer: Self-pay | Admitting: *Deleted

## 2019-05-11 ENCOUNTER — Other Ambulatory Visit: Payer: Self-pay

## 2019-05-11 DIAGNOSIS — Z20822 Contact with and (suspected) exposure to covid-19: Secondary | ICD-10-CM

## 2019-05-12 LAB — NOVEL CORONAVIRUS, NAA: SARS-CoV-2, NAA: NOT DETECTED

## 2019-07-03 ENCOUNTER — Other Ambulatory Visit: Payer: Self-pay

## 2019-07-03 DIAGNOSIS — Z20822 Contact with and (suspected) exposure to covid-19: Secondary | ICD-10-CM

## 2019-07-05 LAB — NOVEL CORONAVIRUS, NAA: SARS-CoV-2, NAA: NOT DETECTED

## 2019-07-10 ENCOUNTER — Other Ambulatory Visit: Payer: Self-pay

## 2019-07-10 DIAGNOSIS — Z20822 Contact with and (suspected) exposure to covid-19: Secondary | ICD-10-CM

## 2019-07-11 LAB — NOVEL CORONAVIRUS, NAA: SARS-CoV-2, NAA: DETECTED — AB

## 2019-07-17 ENCOUNTER — Other Ambulatory Visit (HOSPITAL_COMMUNITY): Payer: Self-pay | Admitting: *Deleted

## 2019-07-17 DIAGNOSIS — Z1231 Encounter for screening mammogram for malignant neoplasm of breast: Secondary | ICD-10-CM

## 2019-08-05 ENCOUNTER — Telehealth: Payer: Self-pay | Admitting: Emergency Medicine

## 2019-08-05 NOTE — Telephone Encounter (Signed)
Pt called asking if we filled out disability paperwork due to covid, pt informed most likely we would not be able to fill out the form, but to bring the form by so we could look at it to see if we could help. Pt verbalized understanding, all questions answered.

## 2019-08-20 ENCOUNTER — Telehealth: Payer: Self-pay | Admitting: General Practice

## 2019-08-20 ENCOUNTER — Encounter (HOSPITAL_COMMUNITY): Payer: Self-pay

## 2019-08-20 NOTE — Telephone Encounter (Signed)
Refax unsuccessful and will send out in the mail again. LVM asking patient for alternate fax #

## 2019-08-20 NOTE — Telephone Encounter (Signed)
patient states she requested back in October and on August 13, 2019 for COVID results to be mailed to home [address] [place] [address]. Patient also requesting COVID results fax to [facility] again due to results not going thru Attention Iona Beard fax # [phone]. Will mail again and re fax.

## 2019-08-29 ENCOUNTER — Ambulatory Visit (HOSPITAL_COMMUNITY)
Admission: RE | Admit: 2019-08-29 | Discharge: 2019-08-29 | Disposition: A | Discharge status: Home / Self Care | Payer: No Typology Code available for payment source | Source: Ambulatory Visit | Attending: Obstetrics and Gynecology | Admitting: Obstetrics and Gynecology

## 2019-08-29 ENCOUNTER — Ambulatory Visit
Admission: RE | Admit: 2019-08-29 | Discharge: 2019-08-29 | Disposition: A | Discharge status: Home / Self Care | Payer: No Typology Code available for payment source | Source: Ambulatory Visit | Attending: Obstetrics and Gynecology | Admitting: Obstetrics and Gynecology

## 2019-08-29 ENCOUNTER — Encounter (HOSPITAL_COMMUNITY): Payer: Self-pay

## 2019-08-29 ENCOUNTER — Other Ambulatory Visit: Payer: Self-pay

## 2019-08-29 DIAGNOSIS — Z1239 Encounter for other screening for malignant neoplasm of breast: Secondary | ICD-10-CM | POA: Insufficient documentation

## 2019-08-29 DIAGNOSIS — Z1231 Encounter for screening mammogram for malignant neoplasm of breast: Secondary | ICD-10-CM

## 2019-08-29 NOTE — Progress Notes (Signed)
No complaints today.   Pap Smear: Pap smear not completed today. Last Pap smear was in September 2018 at the [facility] Department and normal per patient. Per patient has no history of an abnormal Pap smear. No Pap smear results are in Epic.  Physical exam: Breasts Breasts symmetrical. No skin abnormalities bilateral breasts. No nipple retraction bilateral breasts. No nipple discharge bilateral breasts. No lymphadenopathy. No lumps palpated bilateral breasts. No complaints of pain or tenderness on exam. Referred patient to the [facility] for a screening mammogram. Appointment scheduled for Thursday, August 29, 2019 at 1510.        Pelvic/Bimanual No Pap smear completed today since last Pap smear was in September 2018 per patient. Pap smear not indicated per BCCCP guidelines.   Smoking History:  Patient has never smoked.  Patient Navigation: Patient education provided. Access to services provided for patient through BCCCP program.   Breast and Cervical Cancer Risk Assessment: Patient has no family history of breast cancer, known genetic mutations, or radiation treatment to the chest before age 66. Patient has no history of cervical dysplasia, immunocompromised, or DES exposure in-utero.  Risk Assessment    Risk Scores      08/29/2019 08/02/2018   Last edited by: Loletta Parish, RN Armond Hang, LPN   5-year risk: 0.7 % 0.6 %   Lifetime risk: 9.5 % 9.6 %

## 2019-08-29 NOTE — Patient Instructions (Signed)
Explained breast self awareness with Moet D Spoto. Patient did not need a Pap smear today due to last Pap smear was in September 2018 per patient. Let her know BCCCP will cover Pap smears every 3 years unless has a history of abnormal Pap smears. Referred patient to the [facility] for a screening mammogram. Appointment scheduled for Thursday, August 29, 2019 at 1510. Patient aware of appointment and will be there. Let patient know the Breast Center will follow up with her within the next couple weeks with results of mammogram by letter or phone. Desarae D Malachi verbalized understanding.  Rajohn Henery, Arvil Chaco, RN 1:58 PM

## 2019-10-18 ENCOUNTER — Other Ambulatory Visit: Payer: No Typology Code available for payment source

## 2019-10-18 ENCOUNTER — Ambulatory Visit: Payer: Medicaid Other | Attending: Internal Medicine

## 2019-10-18 DIAGNOSIS — Z20822 Contact with and (suspected) exposure to covid-19: Secondary | ICD-10-CM

## 2019-10-19 LAB — NOVEL CORONAVIRUS, NAA: SARS-CoV-2, NAA: NOT DETECTED

## 2020-04-07 ENCOUNTER — Ambulatory Visit
Admission: EM | Admit: 2020-04-07 | Discharge: 2020-04-07 | Disposition: A | Discharge status: Home / Self Care | Payer: No Typology Code available for payment source | Attending: Emergency Medicine | Admitting: Emergency Medicine

## 2020-04-07 ENCOUNTER — Encounter: Payer: Self-pay | Admitting: Emergency Medicine

## 2020-04-07 ENCOUNTER — Other Ambulatory Visit: Payer: Self-pay

## 2020-04-07 DIAGNOSIS — N3001 Acute cystitis with hematuria: Secondary | ICD-10-CM

## 2020-04-07 LAB — POCT URINALYSIS DIP (MANUAL ENTRY)
Bilirubin, UA: NEGATIVE
Glucose, UA: NEGATIVE mg/dL
Ketones, POC UA: NEGATIVE mg/dL
Nitrite, UA: NEGATIVE
Protein Ur, POC: 100 mg/dL — AB
Spec Grav, UA: 1.02 (ref 1.010–1.025)
Urobilinogen, UA: 0.2 E.U./dL
pH, UA: 7 (ref 5.0–8.0)

## 2020-04-07 LAB — POCT URINE PREGNANCY: Preg Test, Ur: NEGATIVE

## 2020-04-07 MED ORDER — FLUCONAZOLE 200 MG PO TABS: 200.0000 mg | ORAL_TABLET | Freq: Once | ORAL | 0 refills | Status: AC

## 2020-04-07 MED ORDER — CEPHALEXIN 500 MG PO CAPS
500.0000 mg | ORAL_CAPSULE | Freq: Two times a day (BID) | ORAL | 0 refills | Status: AC
Start: 2020-04-07 — End: 2020-04-10

## 2020-04-07 NOTE — ED Provider Notes (Signed)
[facility]    CSN: [id number] Arrival date & time: 04/07/20  0831      History   Chief Complaint Chief Complaint  Patient presents with  . Dysuria    HPI Valerie Rivera is a 44 y.o. female presenting for possible UTI.  States last night she developed lower abdominal pressure, cramping, urinary frequency and dysuria.  Patient states pressure is increased today and she is noticed her urine got darker.  Denies back pain, vaginal discharge, pelvic pain, fever.   Past Medical History:  Diagnosis Date  . Blood transfusion without reported diagnosis   . Hives     Patient Active Problem List   Diagnosis Date Noted  . Screening breast examination 08/29/2019  . BACTERIAL VAGINITIS 08/07/2007    Past Surgical History:  Procedure Laterality Date  . CESAREAN SECTION  12/29/2001, 12/16/2009    OB History    Gravida  2   Para      Term      Preterm      AB      Living  2     SAB      TAB      Ectopic      Multiple      Live Births  2            Home Medications    Prior to Admission medications   Medication Sig Start Date End Date Taking? Authorizing Provider  famotidine (PEPCID) 10 MG tablet Take 10 mg by mouth 2 (two) times daily.   Yes [provider]  fexofenadine (ALLEGRA) 60 MG tablet Take 60 mg by mouth 2 (two) times daily.   Yes [provider]  cephALEXin (KEFLEX) 500 MG capsule Take 1 capsule (500 mg total) by mouth 2 (two) times daily for 3 days. 04/07/20 04/10/20  Hall-Potvin, [place], PA-C  fluconazole (DIFLUCAN) 200 MG tablet Take 1 tablet (200 mg total) by mouth once for 1 dose. May repeat in 72 hours if needed 04/07/20 04/07/20  Hall-Potvin, [place], PA-C  ibuprofen (ADVIL,MOTRIN) 200 MG tablet Take 200 mg by mouth every 6 (six) hours as needed for moderate pain.    [provider]    Family History Family History  Adopted: Yes  Problem Relation Age of Onset  . Breast cancer Neg Hx      Social History Social History   Tobacco Use  . Smoking status: Never Smoker  . Smokeless tobacco: Never Used  Vaping Use  . Vaping Use: Never used  Substance Use Topics  . Alcohol use: Yes    Comment: occassionally  . Drug use: No     Allergies   Patient has no known allergies.   Review of Systems As per HPI   Physical Exam Triage Vital Signs ED Triage Vitals  Enc Vitals Group     BP      Pulse      Resp      Temp      Temp src      SpO2      Weight      Height      Head Circumference      Peak Flow      Pain Score      Pain Loc      Pain Edu?      Excl. in GC?    No data found.  Updated Vital Signs BP 122/82 (BP Location: Left Arm)   Pulse 76   Temp  98.1 F (36.7 C) (Oral)   Resp 16   LMP 03/12/2020   SpO2 97%   Visual Acuity Right Eye Distance:   Left Eye Distance:   Bilateral Distance:    Right Eye Near:   Left Eye Near:    Bilateral Near:     Physical Exam Constitutional:      General: She is not in acute distress. HENT:     Head: Normocephalic and atraumatic.  Eyes:     General: No scleral icterus.    Pupils: Pupils are equal, round, and reactive to light.  Cardiovascular:     Rate and Rhythm: Normal rate.  Pulmonary:     Effort: Pulmonary effort is normal.  Abdominal:     General: Bowel sounds are normal.     Palpations: Abdomen is soft.     Tenderness: There is no abdominal tenderness. There is no right CVA tenderness, left CVA tenderness or guarding.  Skin:    Coloration: Skin is not jaundiced or pale.  Neurological:     Mental Status: She is alert and oriented to person, place, and time.      UC Treatments / Results  Labs (all labs ordered are listed, but only abnormal results are displayed) Labs Reviewed  POCT URINALYSIS DIP (MANUAL ENTRY) - Abnormal; Notable for the following components:      Result Value   Clarity, UA cloudy (*)    Blood, UA large (*)    Protein Ur, POC =100 (*)    Leukocytes, UA Small  (1+) (*)    All other components within normal limits  URINE CULTURE  POCT URINE PREGNANCY    EKG   Radiology No results found.  Procedures Procedures (including critical care time)  Medications Ordered in UC Medications - No data to display  Initial Impression / Assessment and Plan / UC Course  I have reviewed the triage vital signs and the nursing notes.  Pertinent labs & imaging results that were available during my care of the patient were reviewed by me and considered in my medical decision making (see chart for details).     Patient afebrile, nontoxic in office today.  Urine pregnancy negative.  Urine dipstick done in office, reviewed by me: Significant for leukocytes, protein, blood.  Urine culture pending.  H&P most consistent with acute cystitis with hematuria.  Will start Keflex today.  Return precautions discussed, patient verbalized understanding and is agreeable to plan. Final Clinical Impressions(s) / UC Diagnoses   Final diagnoses:  Acute cystitis with hematuria     Discharge Instructions     Take antibiotic twice daily with food. Important to drink plenty of water throughout the day. May continue Azo as needed for burning sensation. Return for worsening urinary symptoms, blood in urine, abdominal or back pain, fever.    ED Prescriptions    Medication Sig Dispense Auth. Provider   cephALEXin (KEFLEX) 500 MG capsule Take 1 capsule (500 mg total) by mouth 2 (two) times daily for 3 days. 6 capsule Hall-Potvin, [place], PA-C   fluconazole (DIFLUCAN) 200 MG tablet Take 1 tablet (200 mg total) by mouth once for 1 dose. May repeat in 72 hours if needed 2 tablet Hall-Potvin, [place], PA-C     PDMP not reviewed this encounter.   Hall-Potvin, [place], PA-C 04/07/20 0900

## 2020-04-07 NOTE — ED Triage Notes (Signed)
Pt presents to [facility] for assessment of lower abdominal pressure, urinary frequency, difficulty urinating.  Pressure increased today and possible blood in urine.

## 2020-04-07 NOTE — Discharge Instructions (Signed)
Take antibiotic twice daily with food. Important to drink plenty of water throughout the day. May continue Azo as needed for burning sensation. Return for worsening urinary symptoms, blood in urine, abdominal or back pain, fever. 

## 2020-04-09 LAB — URINE CULTURE: Culture: >=100000 — AB

## 2020-04-23 ENCOUNTER — Ambulatory Visit
Admission: EM | Admit: 2020-04-23 | Discharge: 2020-04-23 | Disposition: A | Discharge status: Home / Self Care | Payer: Medicaid Other | Attending: Physician Assistant | Admitting: Physician Assistant

## 2020-04-23 ENCOUNTER — Encounter: Payer: Self-pay | Admitting: Emergency Medicine

## 2020-04-23 ENCOUNTER — Other Ambulatory Visit: Payer: Self-pay

## 2020-04-23 DIAGNOSIS — L819 Disorder of pigmentation, unspecified: Secondary | ICD-10-CM

## 2020-04-23 NOTE — Discharge Instructions (Addendum)
No alarming signs on exam. Monitor for any stains that could be on the floor. Follow up with PCP as scheduled or follow up with dermatology

## 2020-04-23 NOTE — ED Provider Notes (Signed)
[facility]    CSN: [id number] Arrival date & time: 04/23/20  1027      History   Chief Complaint Chief Complaint  Patient presents with  . Rash    HPI Valerie Rivera is a 44 y.o. female.   44 year old female comes in for rash to the bottom left foot. States first noticed it a few weeks ago, and lasted for a few days and resolved. States noticed it again today. Denies any itching, pain, irritation. Noticed rash from seeing it. Has not spread. No obvious new contacts. Concerns for circulatory issues.     Past Medical History:  Diagnosis Date  . Blood transfusion without reported diagnosis   . Hives     Patient Active Problem List   Diagnosis Date Noted  . Screening breast examination 08/29/2019  . BACTERIAL VAGINITIS 08/07/2007    Past Surgical History:  Procedure Laterality Date  . CESAREAN SECTION  12/29/2001, 12/16/2009    OB History    Gravida  2   Para      Term      Preterm      AB      Living  2     SAB      TAB      Ectopic      Multiple      Live Births  2            Home Medications    Prior to Admission medications   Medication Sig Start Date End Date Taking? Authorizing Provider  famotidine (PEPCID) 10 MG tablet Take 10 mg by mouth 2 (two) times daily.    [provider]  fexofenadine (ALLEGRA) 60 MG tablet Take 60 mg by mouth 2 (two) times daily.    [provider]  ibuprofen (ADVIL,MOTRIN) 200 MG tablet Take 200 mg by mouth every 6 (six) hours as needed for moderate pain.    [provider]    Family History Family History  Adopted: Yes  Problem Relation Age of Onset  . Breast cancer Neg Hx     Social History Social History   Tobacco Use  . Smoking status: Never Smoker  . Smokeless tobacco: Never Used  Vaping Use  . Vaping Use: Never used  Substance Use Topics  . Alcohol use: Yes    Comment: occassionally  . Drug use: No     Allergies   Patient has no known  allergies.   Review of Systems Review of Systems  Reason unable to perform ROS: See HPI as above.     Physical Exam Triage Vital Signs ED Triage Vitals [04/23/20 1035]  Enc Vitals Group     BP (!) 129/85     Pulse Rate 86     Resp 16     Temp 98.7 F (37.1 C)     Temp Source Oral     SpO2 94 %     Weight      Height      Head Circumference      Peak Flow      Pain Score 0     Pain Loc      Pain Edu?      Excl. in GC?    No data found.  Updated Vital Signs BP (!) 129/85 (BP Location: Left Arm)   Pulse 86   Temp 98.7 F (37.1 C) (Oral)   Resp 16   LMP 04/11/2020   SpO2 94%   Physical  Exam Constitutional:      General: She is not in acute distress.    Appearance: Normal appearance. She is well-developed. She is not toxic-appearing or diaphoretic.  HENT:     Head: Normocephalic and atraumatic.  Eyes:     Conjunctiva/sclera: Conjunctivae normal.     Pupils: Pupils are equal, round, and reactive to light.  Pulmonary:     Effort: Pulmonary effort is normal. No respiratory distress.  Musculoskeletal:     Cervical back: Normal range of motion and neck supple.  Skin:    General: Skin is warm and dry.     Comments: See picture below. Irregular patterns of brown pigmentation to the planar left foot. Non raised, non tender. No purpura seen. No erythema, warmth. Pedal pulse 2+, cap refill <2s  Neurological:     Mental Status: She is alert and oriented to person, place, and time.        UC Treatments / Results  Labs (all labs ordered are listed, but only abnormal results are displayed) Labs Reviewed - No data to display  EKG   Radiology No results found.  Procedures Procedures (including critical care time)  Medications Ordered in UC Medications - No data to display  Initial Impression / Assessment and Plan / UC Course  I have reviewed the triage vital signs and the nursing notes.  Pertinent labs & imaging results that were available during my care  of the patient were reviewed by me and considered in my medical decision making (see chart for details).    Discussed appearance of pigmentation almost suspicious for staining from paint/coloring, however, no obvious known exposure. No purpura, pedal pulse 2+, cap refill <2s, no signs of compromised circulation. Reassurance provided. Will provide resources for dermatology if symptoms does not improve, or worsens. To follow up with PCP/dermatology for further evaluation.  Final Clinical Impressions(s) / UC Diagnoses   Final diagnoses:  Discoloration of skin of foot    ED Prescriptions    None     PDMP not reviewed this encounter.   Belinda Fisher, PA-C 04/23/20 1110

## 2020-04-23 NOTE — ED Notes (Signed)
Patient able to ambulate independently  

## 2020-04-23 NOTE — ED Triage Notes (Addendum)
Pt presents to [facility] for assessment of rash to bottom of left foot that started a few weeks ago, lasted a few days, and went away.  Patient states it popped back up again today.  Patient states she was on chronic medications for hives x 2 years and stopped 3 weeks ago.  Is concerned for vein or arterial issues.  Patient has dates of "flare ups" marked on her health calendar.  Patient also had COVID in October 2020

## 2020-05-14 ENCOUNTER — Other Ambulatory Visit: Payer: Self-pay

## 2020-05-14 ENCOUNTER — Ambulatory Visit (INDEPENDENT_AMBULATORY_CARE_PROVIDER_SITE_OTHER): Payer: Medicaid Other | Admitting: Nurse Practitioner

## 2020-05-14 ENCOUNTER — Encounter: Payer: Self-pay | Admitting: Nurse Practitioner

## 2020-05-14 VITALS — BP 129/81 | HR 88 | Temp 99.3°F | Ht 63.0 in | Wt 207.0 lb

## 2020-05-14 DIAGNOSIS — Z131 Encounter for screening for diabetes mellitus: Secondary | ICD-10-CM

## 2020-05-14 DIAGNOSIS — Z7689 Persons encountering health services in other specified circumstances: Secondary | ICD-10-CM

## 2020-05-14 DIAGNOSIS — Z130 Encounter for screening for diseases of the blood and blood-forming organs and certain disorders involving the immune mechanism: Secondary | ICD-10-CM

## 2020-05-14 DIAGNOSIS — E669 Obesity, unspecified: Secondary | ICD-10-CM

## 2020-05-14 DIAGNOSIS — Z13220 Encounter for screening for lipoid disorders: Secondary | ICD-10-CM | POA: Diagnosis not present

## 2020-05-14 NOTE — Progress Notes (Signed)
[facility]ated [facility] Case Management Referral Note  05/14/2020 Name: JOSEPH JOHNS MRN: [id number] DOB: 1976/01/05 Lynford Citizen Cumbo is a 44 y.o. year old female who sees Patient, No Pcp Per for primary care. LCSW was consulted to assess patient's needs and assist the patient with Financial Difficulties related to low income and no health coverage.  Interpreter: No.   Interpreter Name & Language: none  Assessment: Patient experiencing Financial constraints related to low income and no health coverage. Patient presented at PCP visit today with completed [facility] financial assistance and [employer]. CSW reviewed applications and supporting documents with patient. Called [facility] together with patient and scheduled appointment with financial counselor to submit application. CSW available from clinic as needed for additional support.  Review of patient status, including review of consultants reports, relevant laboratory and other test results, and collaboration with appropriate care team members and the patient's provider was performed as part of comprehensive patient evaluation and provision of services.    SDOH (Social Determinants of Health) assessments performed: No    Outpatient Encounter Medications as of 05/14/2020  Medication Sig  . famotidine (PEPCID) 10 MG tablet Take 10 mg by mouth 2 (two) times daily.  . fexofenadine (ALLEGRA) 60 MG tablet Take 60 mg by mouth 2 (two) times daily.  Marland Kitchen ibuprofen (ADVIL,MOTRIN) 200 MG tablet Take 200 mg by mouth every 6 (six) hours as needed for moderate pain.   No facility-administered encounter medications on file as of 05/14/2020.    Goals Addressed   None      Follow up Plan: 1. CSW available from clinic as needed   Abigail Butts, LCSW [facility] [phone]

## 2020-05-14 NOTE — Patient Instructions (Signed)
Plantar Fasciitis  Plantar fasciitis is a painful foot condition that affects the heel. It occurs when the band of tissue that connects the toes to the heel bone (plantar fascia) becomes irritated. This can happen as the result of exercising too much or doing other repetitive activities (overuse injury). The pain from plantar fasciitis can range from mild irritation to severe pain that makes it difficult to walk or move. The pain is usually worse in the morning after sleeping, or after sitting or lying down for a while. Pain may also be worse after long periods of walking or standing. What are the causes? This condition may be caused by:  Standing for long periods of time.  Wearing shoes that do not have good arch support.  Doing activities that put stress on joints (high-impact activities), including running, aerobics, and ballet.  Being overweight.  An abnormal way of walking (gait).  Tight muscles in the back of your lower leg (calf).  High arches in your feet.  Starting a new athletic activity. What are the signs or symptoms? The main symptom of this condition is heel pain. Pain may:  Be worse with first steps after a time of rest, especially in the morning after sleeping or after you have been sitting or lying down for a while.  Be worse after long periods of standing still.  Decrease after 30-45 minutes of activity, such as gentle walking. How is this diagnosed? This condition may be diagnosed based on your medical history and your symptoms. Your health care provider may ask questions about your activity level. Your health care provider will do a physical exam to check for:  A tender area on the bottom of your foot.  A high arch in your foot.  Pain when you move your foot.  Difficulty moving your foot. You may have imaging tests to confirm the diagnosis, such as:  X-rays.  Ultrasound.  MRI. How is this treated? Treatment for plantar fasciitis depends on how  severe your condition is. Treatment may include:  Rest, ice, applying pressure (compression), and raising the affected foot (elevation). This may be called RICE therapy. Your health care provider may recommend RICE therapy along with over-the-counter pain medicines to manage your pain.  Exercises to stretch your calves and your plantar fascia.  A splint that holds your foot in a stretched, upward position while you sleep (night splint).  Physical therapy to relieve symptoms and prevent problems in the future.  Injections of steroid medicine (cortisone) to relieve pain and inflammation.  Stimulating your plantar fascia with electrical impulses (extracorporeal shock wave therapy). This is usually the last treatment option before surgery.  Surgery, if other treatments have not worked after 12 months. Follow these instructions at home:  Managing pain, stiffness, and swelling  If directed, put ice on the painful area: ? Put ice in a plastic bag, or use a frozen bottle of water. ? Place a towel between your skin and the bag or bottle. ? Roll the bottom of your foot over the bag or bottle. ? Do this for 20 minutes, 2-3 times a day.  Wear athletic shoes that have air-sole or gel-sole cushions, or try wearing soft shoe inserts that are designed for plantar fasciitis.  Raise (elevate) your foot above the level of your heart while you are sitting or lying down. Activity  Avoid activities that cause pain. Ask your health care provider what activities are safe for you.  Do physical therapy exercises and stretches as told   by your health care provider.  Try activities and forms of exercise that are easier on your joints (low-impact). Examples include swimming, water aerobics, and biking. General instructions  Take over-the-counter and prescription medicines only as told by your health care provider.  Wear a night splint while sleeping, if told by your health care provider. Loosen the splint  if your toes tingle, become numb, or turn cold and blue.  Maintain a healthy weight, or work with your health care provider to lose weight as needed.  Keep all follow-up visits as told by your health care provider. This is important. Contact a health care provider if you:  Have symptoms that do not go away after caring for yourself at home.  Have pain that gets worse.  Have pain that affects your ability to move or do your daily activities. Summary  Plantar fasciitis is a painful foot condition that affects the heel. It occurs when the band of tissue that connects the toes to the heel bone (plantar fascia) becomes irritated.  The main symptom of this condition is heel pain that may be worse after exercising too much or standing still for a long time.  Treatment varies, but it usually starts with rest, ice, compression, and elevation (RICE therapy) and over-the-counter medicines to manage pain. This information is not intended to replace advice given to you by your health care provider. Make sure you discuss any questions you have with your health care provider. Document Revised: 08/25/2017 Document Reviewed: 07/10/2017 Elsevier Patient Education  2020 [employer]  Plantar Fasciitis Rehab Ask your health care provider which exercises are safe for you. Do exercises exactly as told by your health care provider and adjust them as directed. It is normal to feel mild stretching, pulling, tightness, or discomfort as you do these exercises. Stop right away if you feel sudden pain or your pain gets worse. Do not begin these exercises until told by your health care provider. Stretching and range-of-motion exercises These exercises warm up your muscles and joints and improve the movement and flexibility of your foot. These exercises also help to relieve pain. Plantar fascia stretch  1. Sit with your left / right leg crossed over your opposite knee. 2. Hold your heel with one hand with that  thumb near your arch. With your other hand, hold your toes and gently pull them back toward the top of your foot. You should feel a stretch on the bottom of your toes or your foot (plantar fascia) or both. 3. Hold this stretch for__________ seconds. 4. Slowly release your toes and return to the starting position. Repeat __________ times. Complete this exercise __________ times a day. Gastrocnemius stretch, standing This exercise is also called a calf (gastroc) stretch. It stretches the muscles in the back of the upper calf. 1. Stand with your hands against a wall. 2. Extend your left / right leg behind you, and bend your front knee slightly. 3. Keeping your heels on the floor and your back knee straight, shift your weight toward the wall. Do not arch your back. You should feel a gentle stretch in your upper left / right calf. 4. Hold this position for __________ seconds. Repeat __________ times. Complete this exercise __________ times a day. Soleus stretch, standing This exercise is also called a calf (soleus) stretch. It stretches the muscles in the back of the lower calf. 1. Stand with your hands against a wall. 2. Extend your left / right leg behind you, and bend your front   knee slightly. 3. Keeping your heels on the floor, bend your back knee and shift your weight slightly over your back leg. You should feel a gentle stretch deep in your lower calf. 4. Hold this position for __________ seconds. Repeat __________ times. Complete this exercise __________ times a day. Gastroc and soleus stretch, standing step This exercise stretches the muscles in the back of the lower leg. These muscles are in the upper calf (gastrocnemius) and the lower calf (soleus). 1. Stand with the ball of your left / right foot on a step. The ball of your foot is on the walking surface, right under your toes. 2. Keep your other foot firmly on the same step. 3. Hold on to the wall or a railing for balance. 4. Slowly  lift your other foot, allowing your body weight to press your left / right heel down over the edge of the step. You should feel a stretch in your left / right calf. 5. Hold this position for __________ seconds. 6. Return both feet to the step. 7. Repeat this exercise with a slight bend in your left / right knee. Repeat __________ times with your left / right knee straight and __________ times with your left / right knee bent. Complete this exercise __________ times a day. Balance exercise This exercise builds your balance and strength control of your arch to help take pressure off your plantar fascia. Single leg stand If this exercise is too easy, you can try it with your eyes closed or while standing on a pillow. 1. Without shoes, stand near a railing or in a doorway. You may hold on to the railing or door frame as needed. 2. Stand on your left / right foot. Keep your big toe down on the floor and try to keep your arch lifted. Do not let your foot roll inward. 3. Hold this position for __________ seconds. Repeat __________ times. Complete this exercise __________ times a day. This information is not intended to replace advice given to you by your health care provider. Make sure you discuss any questions you have with your health care provider. Document Revised: 01/03/2019 Document Reviewed: 07/11/2018 Elsevier Patient Education  2020 Elsevier Inc.  

## 2020-05-14 NOTE — Progress Notes (Signed)
[facility] Patient [facility] [address] [place], [place]  [address] Phone:  [phone]   Fax:  [phone]   New Patient Office Visit  Subjective:  Patient ID: Valerie Rivera, female    DOB: 06-14-1976  Age: 44 y.o. MRN: [id number]  CC:  Chief Complaint  Patient presents with  . Establish Care    HPI Nyhla D Gali presents to establish. She  has a past medical history of Blood transfusion without reported diagnosis and Hives.  She has chronic dry eye. She admits that has been treating this with famotidine and fexofenadine.  She had has stains underneath the bottom of her left foot. She admits that that this has been going on for about 1 month. It comes and goes. She was referral to the dermatologist apt pending. She Denies headache, dizziness, visual changes, shortness of breath, dyspnea on exertion, chest pain, nausea, vomiting or any edema.    Past Medical History:  Diagnosis Date  . Blood transfusion without reported diagnosis   . Hives     Past Surgical History:  Procedure Laterality Date  . CESAREAN SECTION  12/29/2001, 12/16/2009    Family History  Adopted: Yes  Problem Relation Age of Onset  . Breast cancer Neg Hx     Social History   Socioeconomic History  . Marital status: Single    Spouse name: Not on file  . Number of children: Not on file  . Years of education: Not on file  . Highest education level: Associate degree: academic program  Occupational History  . Not on file  Tobacco Use  . Smoking status: Never Smoker  . Smokeless tobacco: Never Used  Vaping Use  . Vaping Use: Never used  Substance and Sexual Activity  . Alcohol use: Yes    Comment: occassionally  . Drug use: No  . Sexual activity: Yes    Birth control/protection: Condom  Other Topics Concern  . Not on file  Social History Narrative  . Not on file   Social Determinants of Health   Financial Resource Strain:   . Difficulty of Paying Living Expenses: Not on  file  Food Insecurity:   . Worried About Programme researcher, broadcasting/film/video in the Last Year: Not on file  . Ran Out of Food in the Last Year: Not on file  Transportation Needs: No Transportation Needs  . Lack of Transportation (Medical): No  . Lack of Transportation (Non-Medical): No  Physical Activity:   . Days of Exercise per Week: Not on file  . Minutes of Exercise per Session: Not on file  Stress:   . Feeling of Stress : Not on file  Social Connections:   . Frequency of Communication with Friends and Family: Not on file  . Frequency of Social Gatherings with Friends and Family: Not on file  . Attends Religious Services: Not on file  . Active Member of Clubs or Organizations: Not on file  . Attends Banker Meetings: Not on file  . Marital Status: Not on file  Intimate Partner Violence:   . Fear of Current or Ex-Partner: Not on file  . Emotionally Abused: Not on file  . Physically Abused: Not on file  . Sexually Abused: Not on file    ROS Review of Systems  Constitutional: Negative.   HENT: Negative.   Eyes: Negative.   Respiratory: Negative.   Cardiovascular: Negative.   Gastrointestinal: Negative.   Genitourinary: Negative.   Musculoskeletal: Negative.   Neurological:  Negative.   Hematological: Negative.     Objective:   Today's Vitals: BP 129/81   Pulse 88   Temp 99.3 F (37.4 C) (Temporal)   Ht 5\' 3"  (1.6 m)   Wt 207 lb (93.9 kg)   SpO2 100%   BMI 36.67 kg/m   Physical Exam Vitals reviewed.  Constitutional:      Appearance: She is obese.  HENT:     Head: Normocephalic and atraumatic.     Nose: Nose normal.     Mouth/Throat:     Mouth: Mucous membranes are moist.     Comments: hoarseness Cardiovascular:     Rate and Rhythm: Normal rate and regular rhythm.  Pulmonary:     Effort: Pulmonary effort is normal.     Breath sounds: Normal breath sounds.  Musculoskeletal:        General: Normal range of motion.     Cervical back: Normal range of  motion.  Skin:    General: Skin is warm and dry.  Neurological:     General: No focal deficit present.     Mental Status: She is alert and oriented to person, place, and time.  Psychiatric:        Mood and Affect: Mood normal.        Behavior: Behavior normal.        Thought Content: Thought content normal.        Judgment: Judgment normal.     Assessment & Plan:   Problem List Items Addressed This Visit    None    Visit Diagnoses    Encounter to establish care    -  Primary   Relevant Orders   TSH   Screening for diabetes mellitus (DM)       Relevant Orders   Comp. Metabolic Panel (12)   TSH   Screening for cholesterol level       Relevant Orders   Lipid panel   Screening for deficiency anemia       Relevant Orders   CBC with Differential/Platelet   Obesity (BMI 35.0-39.9 without comorbidity)     Lifestyle modification with healthy diet (fewer calories, more high fiber foods, whole grains and non-starchy vegetables, lower fat meat and fish, low-fat diary include healthy oils) regular exercise (physical activity) and weight loss       Outpatient Encounter Medications as of 05/14/2020  Medication Sig  . famotidine (PEPCID) 10 MG tablet Take 10 mg by mouth 2 (two) times daily.  . fexofenadine (ALLEGRA) 60 MG tablet Take 60 mg by mouth 2 (two) times daily.  05/16/2020 ibuprofen (ADVIL,MOTRIN) 200 MG tablet Take 200 mg by mouth every 6 (six) hours as needed for moderate pain.   No facility-administered encounter medications on file as of 05/14/2020.    Follow-up: Return in about 6 months (around 11/14/2020).   11/16/2020, NP

## 2020-05-15 ENCOUNTER — Encounter: Payer: Self-pay | Admitting: Nurse Practitioner

## 2020-05-18 ENCOUNTER — Other Ambulatory Visit (INDEPENDENT_AMBULATORY_CARE_PROVIDER_SITE_OTHER): Payer: Medicaid Other

## 2020-05-18 ENCOUNTER — Other Ambulatory Visit: Payer: Self-pay

## 2020-05-18 DIAGNOSIS — Z7689 Persons encountering health services in other specified circumstances: Secondary | ICD-10-CM

## 2020-05-18 DIAGNOSIS — Z130 Encounter for screening for diseases of the blood and blood-forming organs and certain disorders involving the immune mechanism: Secondary | ICD-10-CM

## 2020-05-18 DIAGNOSIS — Z131 Encounter for screening for diabetes mellitus: Secondary | ICD-10-CM

## 2020-05-18 DIAGNOSIS — Z13220 Encounter for screening for lipoid disorders: Secondary | ICD-10-CM | POA: Diagnosis not present

## 2020-05-19 LAB — COMP. METABOLIC PANEL (12)
AST: 18 IU/L (ref 0–40)
Albumin/Globulin Ratio: 1.1 — ABNORMAL LOW (ref 1.2–2.2)
Albumin: 3.8 g/dL (ref 3.8–4.8)
Alkaline Phosphatase: 86 IU/L (ref 48–121)
BUN/Creatinine Ratio: 11 (ref 9–23)
BUN: 8 mg/dL (ref 6–24)
Bilirubin Total: 0.3 mg/dL (ref 0.0–1.2)
Calcium: 9 mg/dL (ref 8.7–10.2)
Chloride: 103 mmol/L (ref 96–106)
Creatinine, Ser: 0.75 mg/dL (ref 0.57–1.00)
GFR calc Af Amer: 113 mL/min/{1.73_m2} (ref >59)
GFR calc non Af Amer: 98 mL/min/{1.73_m2} (ref >59)
Globulin, Total: 3.6 g/dL (ref 1.5–4.5)
Glucose: 93 mg/dL (ref 65–99)
Potassium: 4.1 mmol/L (ref 3.5–5.2)
Sodium: 138 mmol/L (ref 134–144)
Total Protein: 7.4 g/dL (ref 6.0–8.5)

## 2020-05-19 LAB — LIPID PANEL
Chol/HDL Ratio: 4.3 ratio (ref 0.0–4.4)
Cholesterol, Total: 152 mg/dL (ref 100–199)
HDL: 35 mg/dL — ABNORMAL LOW (ref >39)
LDL Chol Calc (NIH): 93 mg/dL (ref 0–99)
Triglycerides: 134 mg/dL (ref 0–149)
VLDL Cholesterol Cal: 24 mg/dL (ref 5–40)

## 2020-05-19 LAB — CBC WITH DIFFERENTIAL/PLATELET
Basophils Absolute: 0 10*3/uL (ref 0.0–0.2)
Basos: 0 %
EOS (ABSOLUTE): 0.1 10*3/uL (ref 0.0–0.4)
Eos: 1 %
Hematocrit: 36.8 % (ref 34.0–46.6)
Hemoglobin: 12.3 g/dL (ref 11.1–15.9)
Immature Grans (Abs): 0 10*3/uL (ref 0.0–0.1)
Immature Granulocytes: 0 %
Lymphocytes Absolute: 2.1 10*3/uL (ref 0.7–3.1)
Lymphs: 30 %
MCH: 30.3 pg (ref 26.6–33.0)
MCHC: 33.4 g/dL (ref 31.5–35.7)
MCV: 91 fL (ref 79–97)
Monocytes Absolute: 0.5 10*3/uL (ref 0.1–0.9)
Monocytes: 7 %
Neutrophils Absolute: 4.2 10*3/uL (ref 1.4–7.0)
Neutrophils: 62 %
Platelets: 343 10*3/uL (ref 150–450)
RBC: 4.06 x10E6/uL (ref 3.77–5.28)
RDW: 13 % (ref 11.7–15.4)
WBC: 6.9 10*3/uL (ref 3.4–10.8)

## 2020-05-19 LAB — TSH: TSH: 1.96 u[IU]/mL (ref 0.450–4.500)

## 2020-05-25 ENCOUNTER — Ambulatory Visit: Payer: Self-pay | Attending: Family Medicine

## 2020-05-25 ENCOUNTER — Other Ambulatory Visit: Payer: Self-pay

## 2020-06-22 ENCOUNTER — Ambulatory Visit: Payer: No Typology Code available for payment source | Admitting: Internal Medicine

## 2020-07-02 ENCOUNTER — Ambulatory Visit: Payer: No Typology Code available for payment source

## 2020-10-07 ENCOUNTER — Other Ambulatory Visit: Payer: Self-pay | Admitting: Obstetrics and Gynecology

## 2020-10-07 DIAGNOSIS — Z1231 Encounter for screening mammogram for malignant neoplasm of breast: Secondary | ICD-10-CM

## 2020-10-13 ENCOUNTER — Ambulatory Visit: Payer: No Typology Code available for payment source

## 2020-11-06 ENCOUNTER — Telehealth: Payer: Self-pay | Admitting: Nurse Practitioner

## 2020-11-06 NOTE — Telephone Encounter (Signed)
Noted  

## 2020-11-06 NOTE — Telephone Encounter (Signed)
Patient states she forgot she had a tampon in, inserted another one. Was successful in removing, but it was in for about 11 hours. She is not feeling anything, but would like to know what she should look for in symptoms or if there is anything she should do now.

## 2020-11-06 NOTE — Telephone Encounter (Signed)
Called ask with patient , she said she that  successful removed the tampon, she no having any fever or  feeling nausea, or stomach cramping, or weakness.No abnormal bleeding or discharge  I told the patient of if she is having  symptoms fever, odor , weakness ,nausea, or vomiting. Go to the ED, Ask pt if she had an ob/gyn pt stated that she just received her insurance  and was [facility] In past. Patient has  an patient with already for 11/13/2020.

## 2020-11-12 ENCOUNTER — Ambulatory Visit: Payer: No Typology Code available for payment source

## 2020-11-12 ENCOUNTER — Telehealth: Payer: Self-pay | Admitting: Nurse Practitioner

## 2020-11-12 NOTE — Telephone Encounter (Signed)
Pt requesting PAP on visit Friday 11/13/20.  Pt states has insurance now and wants PCP to provider annual paps now.

## 2020-11-13 ENCOUNTER — Other Ambulatory Visit: Payer: Self-pay

## 2020-11-13 ENCOUNTER — Ambulatory Visit (INDEPENDENT_AMBULATORY_CARE_PROVIDER_SITE_OTHER): Payer: Medicaid Other | Admitting: Nurse Practitioner

## 2020-11-13 ENCOUNTER — Encounter: Payer: Self-pay | Admitting: Nurse Practitioner

## 2020-11-13 VITALS — BP 129/74 | HR 76 | Ht 63.0 in | Wt 208.0 lb

## 2020-11-13 DIAGNOSIS — Z1159 Encounter for screening for other viral diseases: Secondary | ICD-10-CM

## 2020-11-13 DIAGNOSIS — Z01419 Encounter for gynecological examination (general) (routine) without abnormal findings: Secondary | ICD-10-CM

## 2020-11-13 DIAGNOSIS — Z131 Encounter for screening for diabetes mellitus: Secondary | ICD-10-CM | POA: Diagnosis not present

## 2020-11-13 DIAGNOSIS — Z130 Encounter for screening for diseases of the blood and blood-forming organs and certain disorders involving the immune mechanism: Secondary | ICD-10-CM

## 2020-11-13 DIAGNOSIS — Z13220 Encounter for screening for lipoid disorders: Secondary | ICD-10-CM

## 2020-11-13 LAB — POCT URINALYSIS DIPSTICK
Bilirubin, UA: NEGATIVE
Blood, UA: NEGATIVE
Glucose, UA: NEGATIVE
Ketones, UA: NEGATIVE
Leukocytes, UA: NEGATIVE
Nitrite, UA: NEGATIVE
Protein, UA: NEGATIVE
Spec Grav, UA: 1.025 (ref 1.010–1.025)
Urobilinogen, UA: 0.2 E.U./dL
pH, UA: 7 (ref 5.0–8.0)

## 2020-11-13 NOTE — Patient Instructions (Signed)
Health Maintenance, Female Adopting a healthy lifestyle and getting preventive care are important in promoting health and wellness. Ask your health care provider about:  The right schedule for you to have regular tests and exams.  Things you can do on your own to prevent diseases and keep yourself healthy. What should I know about diet, weight, and exercise? Eat a healthy diet  Eat a diet that includes plenty of vegetables, fruits, low-fat dairy products, and lean protein.  Do not eat a lot of foods that are high in solid fats, added sugars, or sodium.   Maintain a healthy weight Body mass index (BMI) is used to identify weight problems. It estimates body fat based on height and weight. Your health care provider can help determine your BMI and help you achieve or maintain a healthy weight. Get regular exercise Get regular exercise. This is one of the most important things you can do for your health. Most adults should:  Exercise for at least 150 minutes each week. The exercise should increase your heart rate and make you sweat (moderate-intensity exercise).  Do strengthening exercises at least twice a week. This is in addition to the moderate-intensity exercise.  Spend less time sitting. Even light physical activity can be beneficial. Watch cholesterol and blood lipids Have your blood tested for lipids and cholesterol at 44 years of age, then have this test every 5 years. Have your cholesterol levels checked more often if:  Your lipid or cholesterol levels are high.  You are older than 44 years of age.  You are at high risk for heart disease. What should I know about cancer screening? Depending on your health history and family history, you may need to have cancer screening at various ages. This may include screening for:  Breast cancer.  Cervical cancer.  Colorectal cancer.  Skin cancer.  Lung cancer. What should I know about heart disease, diabetes, and high blood  pressure? Blood pressure and heart disease  High blood pressure causes heart disease and increases the risk of stroke. This is more likely to develop in people who have high blood pressure readings, are of African descent, or are overweight.  Have your blood pressure checked: ? Every 3-5 years if you are 18-39 years of age. ? Every year if you are 40 years old or older. Diabetes Have regular diabetes screenings. This checks your fasting blood sugar level. Have the screening done:  Once every three years after age 40 if you are at a normal weight and have a low risk for diabetes.  More often and at a younger age if you are overweight or have a high risk for diabetes. What should I know about preventing infection? Hepatitis B If you have a higher risk for hepatitis B, you should be screened for this virus. Talk with your health care provider to find out if you are at risk for hepatitis B infection. Hepatitis C Testing is recommended for:  Everyone born from 1945 through 1965.  Anyone with known risk factors for hepatitis C. Sexually transmitted infections (STIs)  Get screened for STIs, including gonorrhea and chlamydia, if: ? You are sexually active and are younger than 44 years of age. ? You are older than 44 years of age and your health care provider tells you that you are at risk for this type of infection. ? Your sexual activity has changed since you were last screened, and you are at increased risk for chlamydia or gonorrhea. Ask your health care provider   if you are at risk.  Ask your health care provider about whether you are at high risk for HIV. Your health care provider may recommend a prescription medicine to help prevent HIV infection. If you choose to take medicine to prevent HIV, you should first get tested for HIV. You should then be tested every 3 months for as long as you are taking the medicine. Pregnancy  If you are about to stop having your period (premenopausal) and  you may become pregnant, seek counseling before you get pregnant.  Take 400 to 800 micrograms (mcg) of folic acid every day if you become pregnant.  Ask for birth control (contraception) if you want to prevent pregnancy. Osteoporosis and menopause Osteoporosis is a disease in which the bones lose minerals and strength with aging. This can result in bone fractures. If you are 65 years old or older, or if you are at risk for osteoporosis and fractures, ask your health care provider if you should:  Be screened for bone loss.  Take a calcium or vitamin D supplement to lower your risk of fractures.  Be given hormone replacement therapy (HRT) to treat symptoms of menopause. Follow these instructions at home: Lifestyle  Do not use any products that contain nicotine or tobacco, such as cigarettes, e-cigarettes, and chewing tobacco. If you need help quitting, ask your health care provider.  Do not use street drugs.  Do not share needles.  Ask your health care provider for help if you need support or information about quitting drugs. Alcohol use  Do not drink alcohol if: ? Your health care provider tells you not to drink. ? You are pregnant, may be pregnant, or are planning to become pregnant.  If you drink alcohol: ? Limit how much you use to 0-1 drink a day. ? Limit intake if you are breastfeeding.  Be aware of how much alcohol is in your drink. In the [place] one drink equals one 12 oz bottle of beer (355 mL), one 5 oz glass of wine (148 mL), or one 1 oz glass of hard liquor (44 mL). General instructions  Schedule regular health, dental, and eye exams.  Stay current with your vaccines.  Tell your health care provider if: ? You often feel depressed. ? You have ever been abused or do not feel safe at home. Summary  Adopting a healthy lifestyle and getting preventive care are important in promoting health and wellness.  Follow your health care provider's instructions about healthy  diet, exercising, and getting tested or screened for diseases.  Follow your health care provider's instructions on monitoring your cholesterol and blood pressure. This information is not intended to replace advice given to you by your health care provider. Make sure you discuss any questions you have with your health care provider. Document Revised: 09/05/2018 Document Reviewed: 09/05/2018 Elsevier Patient Education  2021 Elsevier Inc.  

## 2020-11-13 NOTE — Addendum Note (Signed)
Addended by: Judd Gaudier on: 11/13/2020 10:19 AM   Modules accepted: Orders

## 2020-11-13 NOTE — Progress Notes (Signed)
   [facility] Patient [facility] [address] [place], [place]  [address] Phone:  [phone]   Fax:  [phone] Subjective:     Valerie Rivera is a 45 y.o. female and is here for a comprehensive physical exam. The patient reports no problems.  Social History   Socioeconomic History  . Marital status: Single    Spouse name: Not on file  . Number of children: Not on file  . Years of education: Not on file  . Highest education level: Associate degree: academic program  Occupational History  . Not on file  Tobacco Use  . Smoking status: Never Smoker  . Smokeless tobacco: Never Used  Vaping Use  . Vaping Use: Never used  Substance and Sexual Activity  . Alcohol use: Yes    Comment: occassionally  . Drug use: No  . Sexual activity: Yes    Birth control/protection: Condom  Other Topics Concern  . Not on file  Social History Narrative  . Not on file   Social Determinants of Health   Financial Resource Strain: Not on file  Food Insecurity: Not on file  Transportation Needs: Not on file  Physical Activity: Not on file  Stress: Not on file  Social Connections: Not on file  Intimate Partner Violence: Not on file   Health Maintenance  Topic Date Due  . Hepatitis C Screening  Never done  . HIV Screening  Never done  . TETANUS/TDAP  12/17/2019  . PAP SMEAR-Modifier  06/23/2020  . COVID-19 Vaccine (1) 11/29/2020 (Originally 11/01/1980)  . INFLUENZA VACCINE  12/24/2020 (Originally 04/26/2020)    The following portions of the patient's history were reviewed and updated as appropriate: allergies, current medications, past family history, past medical history, past social history, past surgical history and problem list.  Review of Systems Pertinent items noted in HPI and remainder of comprehensive ROS otherwise negative.   Objective:    BP 129/74   Pulse 76   Ht 5\' 3"  (1.6 m)   Wt 208 lb (94.3 kg)   LMP 11/02/2020   SpO2 98%   BMI 36.85 kg/m  General appearance:  alert, cooperative, no distress and moderately obese Breasts: normal appearance, no masses or tenderness Pelvic: cervix normal in appearance, external genitalia normal, no adnexal masses or tenderness, no cervical motion tenderness, rectovaginal septum normal, uterus normal size, shape, and consistency and vagina normal without discharge    Assessment:    Healthy female exam.     Plan:     See After Visit Summary for Counseling Recommendations

## 2020-11-14 LAB — COMP. METABOLIC PANEL (12)
AST: 21 IU/L (ref 0–40)
Albumin/Globulin Ratio: 1.1 — ABNORMAL LOW (ref 1.2–2.2)
Albumin: 4 g/dL (ref 3.8–4.8)
Alkaline Phosphatase: 83 IU/L (ref 44–121)
BUN/Creatinine Ratio: 10 (ref 9–23)
BUN: 8 mg/dL (ref 6–24)
Bilirubin Total: 0.3 mg/dL (ref 0.0–1.2)
Calcium: 9 mg/dL (ref 8.7–10.2)
Chloride: 107 mmol/L — ABNORMAL HIGH (ref 96–106)
Creatinine, Ser: 0.81 mg/dL (ref 0.57–1.00)
GFR calc Af Amer: 102 mL/min/{1.73_m2} (ref >59)
GFR calc non Af Amer: 89 mL/min/{1.73_m2} (ref >59)
Globulin, Total: 3.7 g/dL (ref 1.5–4.5)
Glucose: 98 mg/dL (ref 65–99)
Potassium: 4.5 mmol/L (ref 3.5–5.2)
Sodium: 143 mmol/L (ref 134–144)
Total Protein: 7.7 g/dL (ref 6.0–8.5)

## 2020-11-14 LAB — CBC WITH DIFFERENTIAL/PLATELET
Basophils Absolute: 0 10*3/uL (ref 0.0–0.2)
Basos: 0 %
EOS (ABSOLUTE): 0.1 10*3/uL (ref 0.0–0.4)
Eos: 1 %
Hematocrit: 36.4 % (ref 34.0–46.6)
Hemoglobin: 12.3 g/dL (ref 11.1–15.9)
Immature Grans (Abs): 0 10*3/uL (ref 0.0–0.1)
Immature Granulocytes: 0 %
Lymphocytes Absolute: 2.2 10*3/uL (ref 0.7–3.1)
Lymphs: 36 %
MCH: 30.2 pg (ref 26.6–33.0)
MCHC: 33.8 g/dL (ref 31.5–35.7)
MCV: 89 fL (ref 79–97)
Monocytes Absolute: 0.4 10*3/uL (ref 0.1–0.9)
Monocytes: 6 %
Neutrophils Absolute: 3.5 10*3/uL (ref 1.4–7.0)
Neutrophils: 57 %
Platelets: 376 10*3/uL (ref 150–450)
RBC: 4.07 x10E6/uL (ref 3.77–5.28)
RDW: 13 % (ref 11.7–15.4)
WBC: 6.2 10*3/uL (ref 3.4–10.8)

## 2020-11-14 LAB — LIPID PANEL
Chol/HDL Ratio: 3.8 ratio (ref 0.0–4.4)
Cholesterol, Total: 151 mg/dL (ref 100–199)
HDL: 40 mg/dL (ref >39)
LDL Chol Calc (NIH): 94 mg/dL (ref 0–99)
Triglycerides: 91 mg/dL (ref 0–149)
VLDL Cholesterol Cal: 17 mg/dL (ref 5–40)

## 2020-11-14 LAB — HEPATITIS C ANTIBODY: Hep C Virus Ab: <0.1 s/co ratio (ref 0.0–0.9)

## 2020-11-19 ENCOUNTER — Ambulatory Visit
Admission: RE | Admit: 2020-11-19 | Discharge: 2020-11-19 | Disposition: A | Discharge status: Home / Self Care | Payer: Medicaid Other | Source: Ambulatory Visit | Attending: Obstetrics and Gynecology | Admitting: Obstetrics and Gynecology

## 2020-11-19 ENCOUNTER — Other Ambulatory Visit: Payer: Self-pay

## 2020-11-19 ENCOUNTER — Ambulatory Visit: Payer: No Typology Code available for payment source

## 2020-11-19 DIAGNOSIS — Z1231 Encounter for screening mammogram for malignant neoplasm of breast: Secondary | ICD-10-CM

## 2020-11-19 LAB — IGP, CTNG, RFX APTIMA HPV ASCU
Chlamydia, Nuc. Acid Amp: NEGATIVE
Gonococcus by Nucleic Acid Amp: NEGATIVE

## 2020-11-19 LAB — HPV APTIMA: HPV Aptima: NEGATIVE

## 2020-11-20 LAB — NUSWAB VAGINITIS (VG)

## 2020-11-24 DIAGNOSIS — Z01419 Encounter for gynecological examination (general) (routine) without abnormal findings: Secondary | ICD-10-CM

## 2020-11-25 ENCOUNTER — Telehealth: Payer: Self-pay

## 2020-11-25 NOTE — Telephone Encounter (Signed)
Called and spoke w/ pt explain in detail all of her lab results that she had done, from her past visit. Pt was very concerns  From the results of pap and wanted no what  abnormal cells meant. Pt wants to Go to Gyn  per [facility] for  Further testing for hpv and possible. She is going call us back with name of the gyn

## 2021-01-06 ENCOUNTER — Other Ambulatory Visit: Payer: Self-pay

## 2021-01-06 ENCOUNTER — Telehealth: Payer: Self-pay

## 2021-01-06 DIAGNOSIS — Z01419 Encounter for gynecological examination (general) (routine) without abnormal findings: Secondary | ICD-10-CM

## 2021-01-06 DIAGNOSIS — R87629 Unspecified abnormal cytological findings in specimens from vagina: Secondary | ICD-10-CM

## 2021-01-06 NOTE — Telephone Encounter (Signed)
Called and spoke with pt she spoke with her insurance company she  will need for her  [facility]. [facility]. Informed the patient

## 2021-01-06 NOTE — Telephone Encounter (Signed)
[facility] Message her in my chart if you can

## 2021-01-12 ENCOUNTER — Telehealth: Payer: Self-pay

## 2021-01-12 NOTE — Telephone Encounter (Signed)
Telephoned patient at home number. Voice mail not available at this time.I will attempt to call patient at a later time.

## 2021-02-08 ENCOUNTER — Encounter: Payer: Self-pay | Admitting: Obstetrics and Gynecology

## 2021-02-08 ENCOUNTER — Other Ambulatory Visit: Payer: Self-pay

## 2021-02-08 ENCOUNTER — Ambulatory Visit (INDEPENDENT_AMBULATORY_CARE_PROVIDER_SITE_OTHER): Payer: Medicaid Other | Admitting: Obstetrics and Gynecology

## 2021-02-08 DIAGNOSIS — Z3202 Encounter for pregnancy test, result negative: Secondary | ICD-10-CM

## 2021-02-08 DIAGNOSIS — R87610 Atypical squamous cells of undetermined significance on cytologic smear of cervix (ASC-US): Secondary | ICD-10-CM | POA: Diagnosis not present

## 2021-02-08 LAB — POCT PREGNANCY, URINE: Preg Test, Ur: NEGATIVE

## 2021-02-08 NOTE — Progress Notes (Signed)
Valerie Rivera was referred for colposcopy today Pap smear 10/2020 ASCUS with negative HPV First abnormal pap smear  Abnormal pap smear and HPV reviewed with pt  PE AF VSS Lungs clear Heart RRR Abd soft, + BS  A/P ASCUS HPV negative As per ASCCP guidelines, colposcopy not indicated. Reviewed with pt. Pt was relieved to hear this news. A note will be sent to pt's PCP. Repeat pap smear with HPV in 3 years or in 1 year per pt choice. F/U PRN

## 2021-03-06 IMAGING — MG MM DIGITAL SCREENING BILAT W/ TOMO AND CAD
8 of 13 series · 8 of 37 positions shown · non-contrast
Comparison: Previous exam(s).

CLINICAL DATA: Screening.

EXAM:
DIGITAL SCREENING BILATERAL MAMMOGRAM WITH TOMOSYNTHESIS AND CAD
TECHNIQUE: Bilateral screening digital craniocaudal and mediolateral oblique
mammograms were obtained. Bilateral screening digital breast
tomosynthesis was performed. The images were evaluated with
computer-aided detection.

[L CC synth-2D]
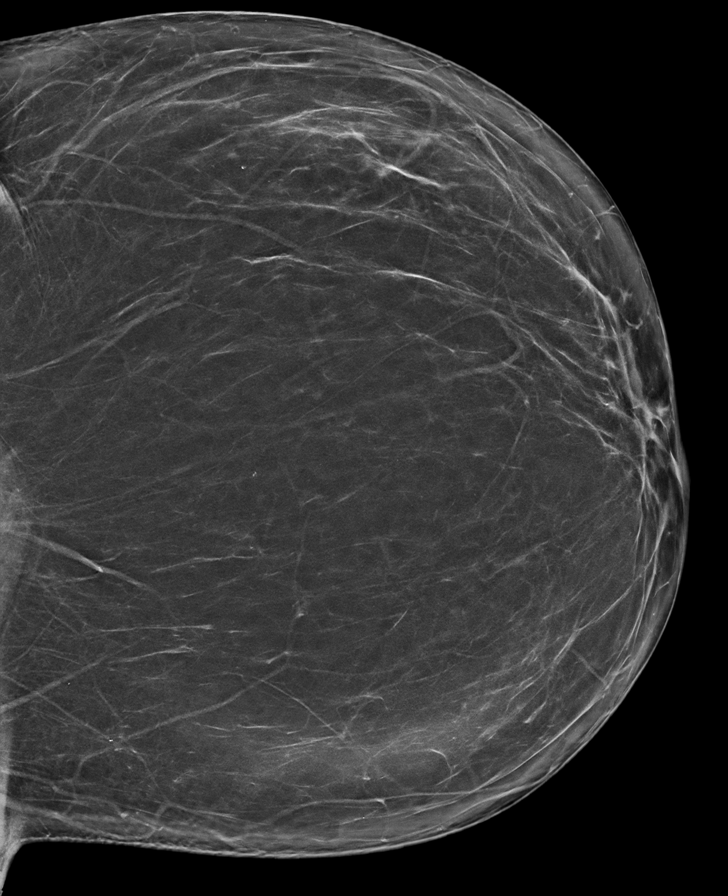

[L MLO synth-2D (1 of 3)]
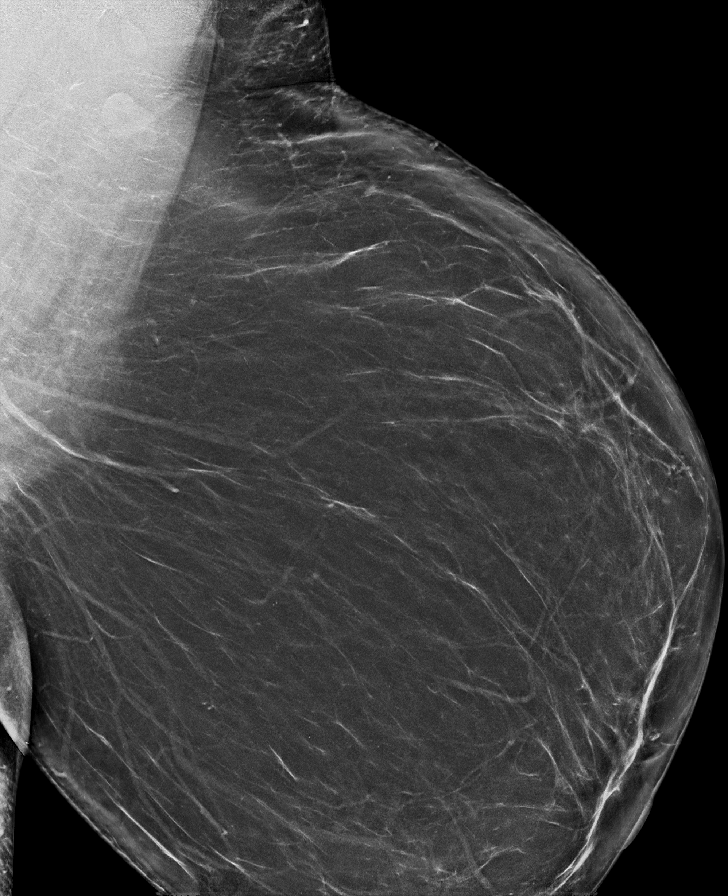

[R MLO synth-2D (1 of 2)]
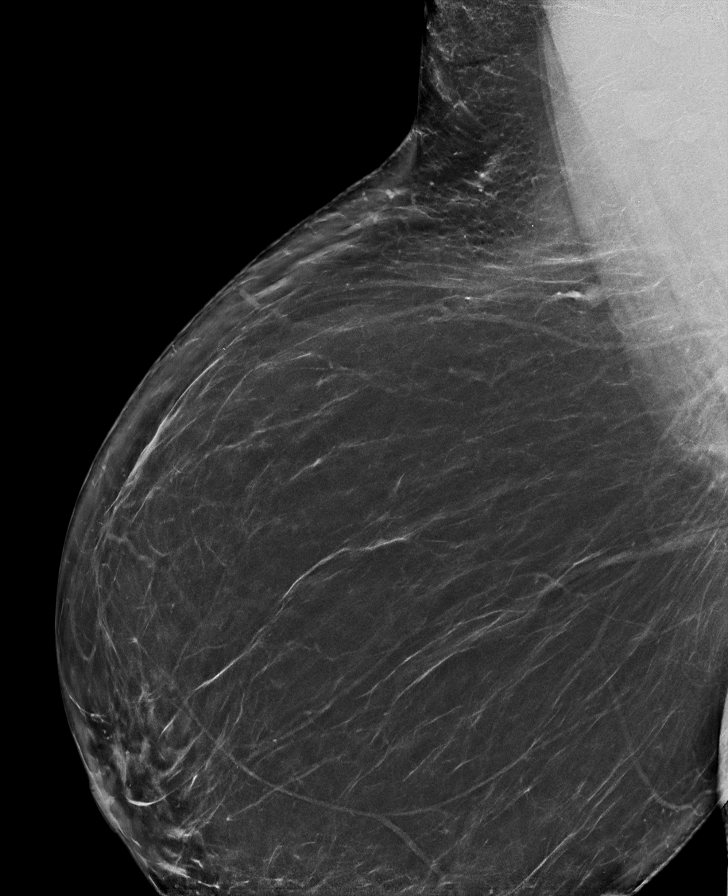

[L MLO synth-2D (2 of 3)]
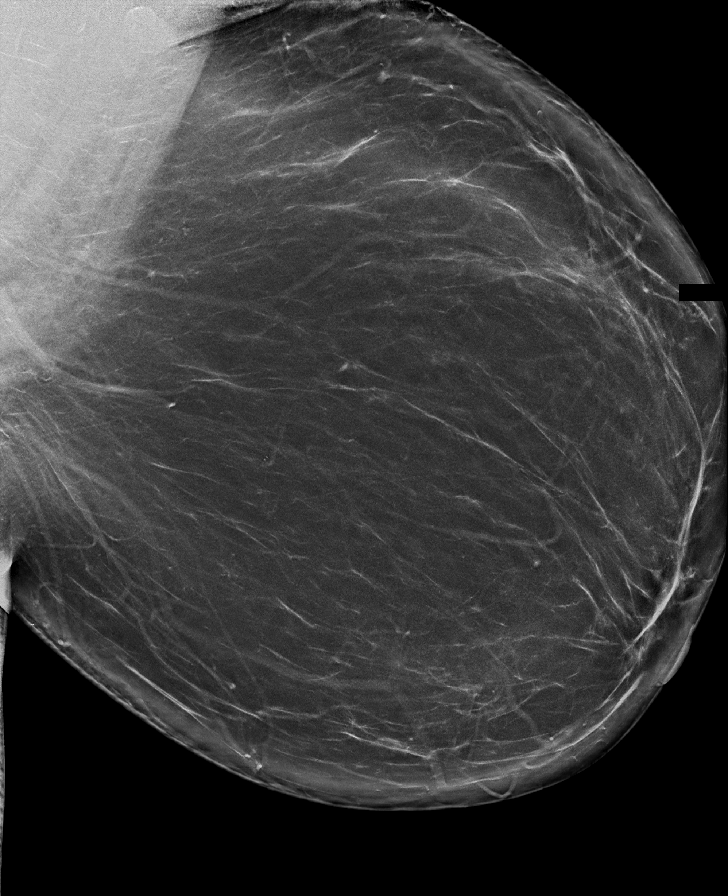

[L MLO synth-2D (3 of 3)]
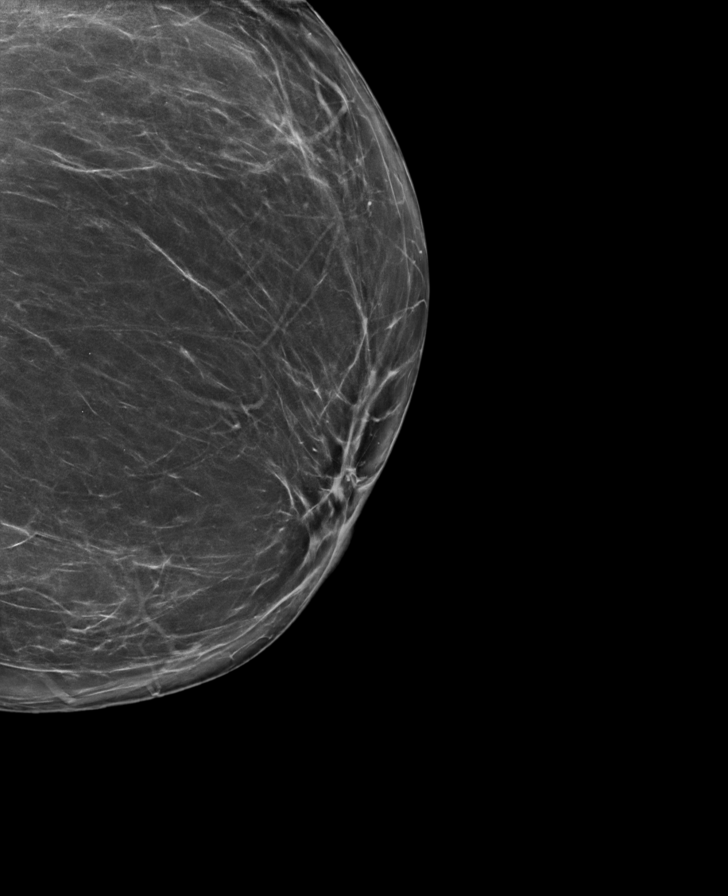

[R CC synth-2D]
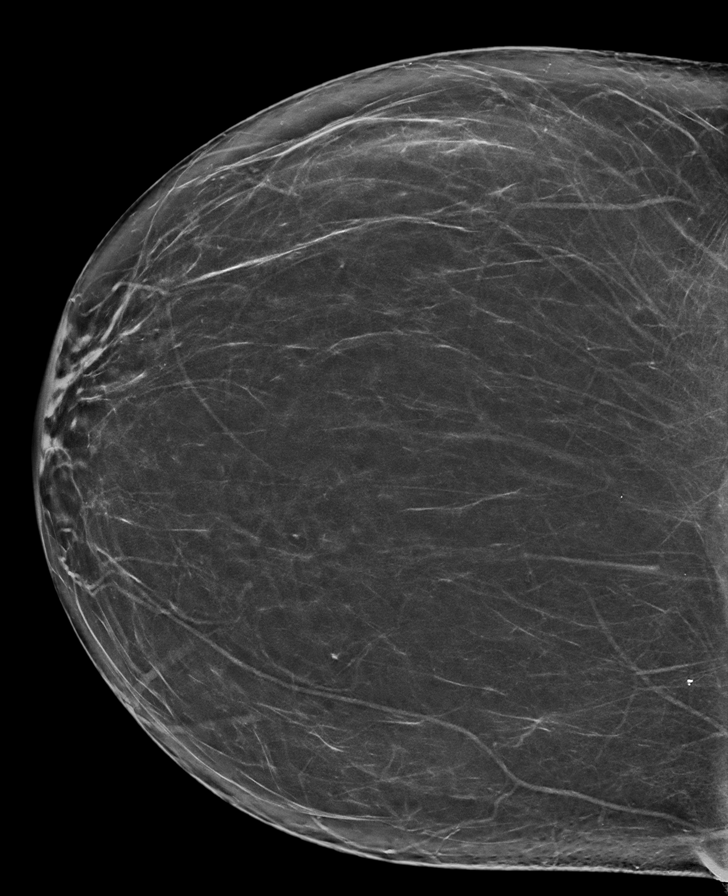

[R MLO synth-2D (2 of 2)]
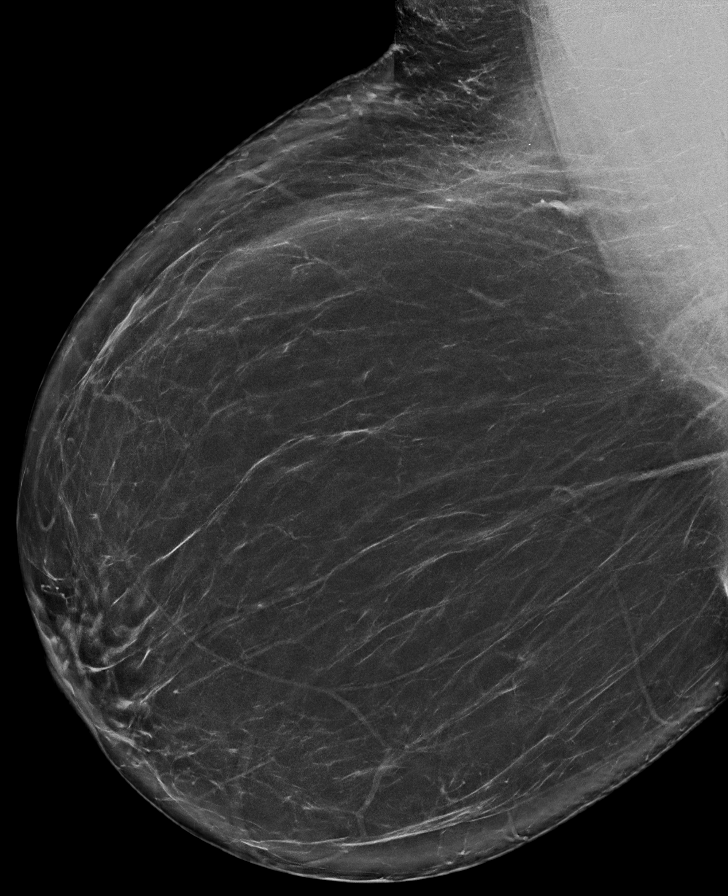

[R MLO tomo · tomo slice 50/99.0]
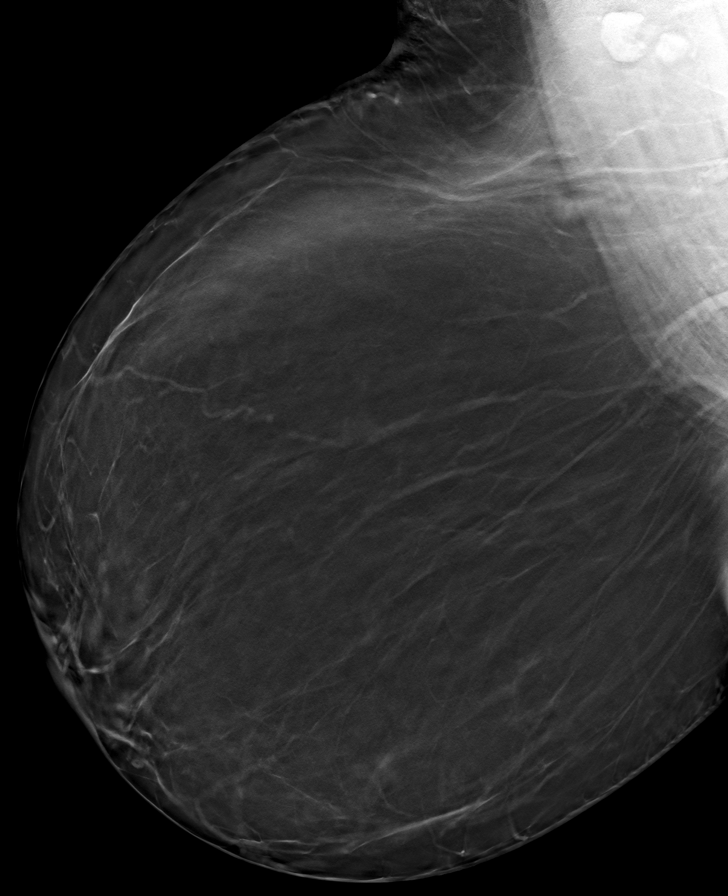

[8 of 37 positions shown; findings below may reference images not displayed]

ACR Breast Density Category b: There are scattered areas of
fibroglandular density.
FINDINGS: There are no findings suspicious for malignancy. The images were
evaluated with computer-aided detection.
IMPRESSION: No mammographic evidence of malignancy. A result letter of this
screening mammogram will be mailed directly to the patient.

RECOMMENDATION:
Screening mammogram in one year. (Code:WJ-[id number])

BI-RADS CATEGORY  1: Negative.

## 2021-04-15 ENCOUNTER — Other Ambulatory Visit: Payer: No Typology Code available for payment source

## 2021-04-15 ENCOUNTER — Ambulatory Visit: Payer: Medicaid Other | Attending: Internal Medicine

## 2021-04-15 DIAGNOSIS — Z20822 Contact with and (suspected) exposure to covid-19: Secondary | ICD-10-CM

## 2021-04-16 LAB — SARS-COV-2, NAA 2 DAY TAT

## 2021-04-16 LAB — NOVEL CORONAVIRUS, NAA: SARS-CoV-2, NAA: NOT DETECTED

## 2021-11-12 ENCOUNTER — Ambulatory Visit: Payer: No Typology Code available for payment source | Admitting: Nurse Practitioner

## 2021-12-27 ENCOUNTER — Other Ambulatory Visit: Payer: Self-pay

## 2021-12-27 ENCOUNTER — Encounter (HOSPITAL_COMMUNITY): Payer: Self-pay | Admitting: Emergency Medicine

## 2021-12-27 ENCOUNTER — Ambulatory Visit (HOSPITAL_COMMUNITY)
Admission: EM | Admit: 2021-12-27 | Discharge: 2021-12-27 | Disposition: A | Discharge status: Home / Self Care | Payer: Medicaid Other | Attending: Family Medicine | Admitting: Family Medicine

## 2021-12-27 DIAGNOSIS — Z23 Encounter for immunization: Secondary | ICD-10-CM | POA: Diagnosis not present

## 2021-12-27 DIAGNOSIS — S61311A Laceration without foreign body of left index finger with damage to nail, initial encounter: Secondary | ICD-10-CM | POA: Diagnosis not present

## 2021-12-27 MED ORDER — TETANUS-DIPHTH-ACELL PERTUSSIS 5-2.5-18.5 LF-MCG/0.5 IM SUSY
0.5000 mL | PREFILLED_SYRINGE | Freq: Once | INTRAMUSCULAR | Status: AC
  Administered 2021-12-27: 0.5 mL via INTRAMUSCULAR

## 2021-12-27 MED ORDER — TETANUS-DIPHTH-ACELL PERTUSSIS 5-2.5-18.5 LF-MCG/0.5 IM SUSY
PREFILLED_SYRINGE | INTRAMUSCULAR | Status: AC
  Filled 2021-12-27: qty 0.5

## 2021-12-27 MED ORDER — IBUPROFEN 800 MG PO TABS: 800.0000 mg | ORAL_TABLET | Freq: Three times a day (TID) | ORAL | 0 refills | Status: DC | PRN

## 2021-12-27 MED ORDER — MUPIROCIN 2 % EX OINT: 1.0000 "application " | TOPICAL_OINTMENT | Freq: Two times a day (BID) | CUTANEOUS | 0 refills | Status: DC

## 2021-12-27 MED ORDER — BACITRACIN ZINC 500 UNIT/GM EX OINT
TOPICAL_OINTMENT | CUTANEOUS | Status: AC
  Filled 2021-12-27: qty 0.9

## 2021-12-27 NOTE — ED Provider Notes (Signed)
?[facility] ? ? ? ?CSN: [id number] ?Arrival date & time: 12/27/21  1131 ? ? ?  ? ?History   ?Chief Complaint ?Chief Complaint  ?Patient presents with  ? Laceration  ? ? ?HPI ?Valerie Rivera is a 46 y.o. female.  ? ? ?Laceration ?Here for laceration to left index finger and nail. ? ?Last night she was cutting cabbage when she cut into her left finger nail bed and finger.  She got the bleeding stopped and held pressure and bandaged it.  This occurred about 7 PM, and currently it is 3:45 in the afternoon. ? ?Currently on her menses. ? ?Last tetanus was 12 years ago or so ? ?Past Medical History:  ?Diagnosis Date  ? Blood transfusion without reported diagnosis   ? Hives   ? ? ?Patient Active Problem List  ? Diagnosis Date Noted  ? ASCUS of cervix with negative high risk HPV 02/08/2021  ? Screening breast examination 08/29/2019  ? ? ?Past Surgical History:  ?Procedure Laterality Date  ? CESAREAN SECTION  12/29/2001, 12/16/2009  ? ? ?OB History   ? ? Gravida  ?2  ? Para  ?   ? Term  ?   ? Preterm  ?   ? AB  ?   ? Living  ?2  ?  ? ? SAB  ?   ? IAB  ?   ? Ectopic  ?   ? Multiple  ?   ? Live Births  ?2  ?   ?  ?  ? ? ? ?Home Medications   ? ?Prior to Admission medications   ?Medication Sig Start Date End Date Taking? Authorizing Provider  ?ibuprofen (ADVIL) 800 MG tablet Take 1 tablet (800 mg total) by mouth every 8 (eight) hours as needed (pain). 12/27/21  Yes Zenia Resides, MD  ?mupirocin ointment (BACTROBAN) 2 % Apply 1 application. topically 2 (two) times daily. To affected area till better 12/27/21  Yes Dorethea Strubel, Janace Aris, MD  ?famotidine (PEPCID) 10 MG tablet Take 10 mg by mouth 2 (two) times daily.    [provider]  ?fexofenadine (ALLEGRA) 60 MG tablet Take 60 mg by mouth 2 (two) times daily.    [provider]  ? ? ?Family History ?Family History  ?Adopted: Yes  ?Problem Relation Age of Onset  ? Breast cancer Neg Hx   ? ? ?Social History ?Social History  ? ?Tobacco Use  ? Smoking  status: Never  ? Smokeless tobacco: Never  ?Vaping Use  ? Vaping Use: Never used  ?Substance Use Topics  ? Alcohol use: Yes  ?  Comment: occassionally  ? Drug use: No  ? ? ? ?Allergies   ?Patient has no known allergies. ? ? ?Review of Systems ?Review of Systems ? ? ?Physical Exam ?Triage Vital Signs ?ED Triage Vitals  ?Enc Vitals Group  ?   BP 12/27/21 1528 (!) 149/90  ?   Pulse Rate 12/27/21 1528 75  ?   Resp 12/27/21 1528 18  ?   Temp 12/27/21 1528 98.2 ?F (36.8 ?C)  ?   Temp Source 12/27/21 1528 Oral  ?   SpO2 12/27/21 1528 98 %  ?   Weight --   ?   Height --   ?   Head Circumference --   ?   Peak Flow --   ?   Pain Score 12/27/21 1525 7  ?   Pain Loc --   ?   Pain Edu? --   ?  Excl. in GC? --   ? ?No data found. ? ?Updated Vital Signs ?BP (!) 149/90 (BP Location: Right Arm)   Pulse 75   Temp 98.2 ?F (36.8 ?C) (Oral)   Resp 18   LMP 12/27/2021   SpO2 98%  ? ?Visual Acuity ?Right Eye Distance:   ?Left Eye Distance:   ?Bilateral Distance:   ? ?Right Eye Near:   ?Left Eye Near:    ?Bilateral Near:    ? ?Physical Exam ?Vitals and nursing note reviewed.  ?Skin: ?   Comments: She has a laceration to the dual side of the nail on the left index finger that is semilunar and about half a centimeter in length.  It is not bleeding.  She also has a laceration through the fingernail from the radial nail fold arcing up to the end of the nail.  This also is not bleeding.  ?Neurological:  ?   Mental Status: She is alert.  ? ? ? ?UC Treatments / Results  ?Labs ?(all labs ordered are listed, but only abnormal results are displayed) ?Labs Reviewed - No data to display ? ?EKG ? ? ?Radiology ?No results found. ? ?Procedures ?Procedures (including critical care time) ? ?Medications Ordered in UC ?Medications  ?Tdap (BOOSTRIX) injection 0.5 mL (has no administration in time range)  ? ? ?Initial Impression / Assessment and Plan / UC Course  ?I have reviewed the triage vital signs and the nursing notes. ? ?Pertinent labs & imaging  results that were available during my care of the patient were reviewed by me and considered in my medical decision making (see chart for details). ? ?  ? ?With her wound being over 12 hours old, and with her having adequate hemostasis and having the nail splinted back on the nail bled, I do not think she needs should have any sutures placed.  We will update her tetanus.  Wound care was explained and bulky bandage was applied. ?Final Clinical Impressions(s) / UC Diagnoses  ? ?Final diagnoses:  ?Laceration of left index finger without foreign body with damage to nail, initial encounter  ? ? ? ?Discharge Instructions   ? ?  ?You have been given a Tdap/tetanus shot today ? ?Apply mupirocin antibiotic ointment 1 or 2 times daily to your cut.  For the next few days keep a very bulky bandage on that finger.  Change the bandage once daily or so.  You can wash it with warm soapy water when you have the bandage off ?Take ibuprofen 800 mg--1 tab every 8 hours as needed for pain.  ? ? ? ? ?ED Prescriptions   ? ? Medication Sig Dispense Auth. Provider  ? mupirocin ointment (BACTROBAN) 2 % Apply 1 application. topically 2 (two) times daily. To affected area till better 22 g Zenia Resides, MD  ? ibuprofen (ADVIL) 800 MG tablet Take 1 tablet (800 mg total) by mouth every 8 (eight) hours as needed (pain). 21 tablet Marlinda Mike Janace Aris, MD  ? ?  ? ?PDMP not reviewed this encounter. ?  ?Zenia Resides, MD ?12/27/21 1551 ? ?

## 2021-12-27 NOTE — Discharge Instructions (Addendum)
You have been given a Tdap/tetanus shot today ? ?Apply mupirocin antibiotic ointment 1 or 2 times daily to your cut.  For the next few days keep a very bulky bandage on that finger.  Change the bandage once daily or so.  You can wash it with warm soapy water when you have the bandage off ?Take ibuprofen 800 mg--1 tab every 8 hours as needed for pain.  ?

## 2021-12-27 NOTE — ED Notes (Signed)
Left  index finger tip, partial avulsion.  Injury involving nail and skin tissue.  Injury occurred last evening around 7pm while cutting vegetables.  Reports cleaning wound with soap and water and peroxide and holding pressure.  Bleeding stopped.  This morning, patient has noticed wound bleeding again.  Currently no bleeding visible.  Patient unsure when last tetanus was received.   ?

## 2021-12-27 NOTE — ED Triage Notes (Signed)
See prior note today

## 2022-09-12 ENCOUNTER — Other Ambulatory Visit: Payer: Self-pay | Admitting: Nurse Practitioner

## 2022-09-12 DIAGNOSIS — Z1231 Encounter for screening mammogram for malignant neoplasm of breast: Secondary | ICD-10-CM

## 2022-09-16 ENCOUNTER — Other Ambulatory Visit (HOSPITAL_COMMUNITY)
Admission: RE | Admit: 2022-09-16 | Discharge: 2022-09-16 | Disposition: A | Discharge status: Home / Self Care | Payer: Medicaid Other | Source: Ambulatory Visit | Attending: Nurse Practitioner | Admitting: Nurse Practitioner

## 2022-09-16 ENCOUNTER — Ambulatory Visit (INDEPENDENT_AMBULATORY_CARE_PROVIDER_SITE_OTHER): Payer: Medicaid Other | Admitting: Nurse Practitioner

## 2022-09-16 ENCOUNTER — Encounter: Payer: Self-pay | Admitting: Nurse Practitioner

## 2022-09-16 VITALS — BP 134/86 | HR 84 | Temp 97.6°F | Ht 63.0 in | Wt 210.0 lb

## 2022-09-16 DIAGNOSIS — Z1211 Encounter for screening for malignant neoplasm of colon: Secondary | ICD-10-CM | POA: Diagnosis not present

## 2022-09-16 DIAGNOSIS — Z1329 Encounter for screening for other suspected endocrine disorder: Secondary | ICD-10-CM | POA: Diagnosis not present

## 2022-09-16 DIAGNOSIS — I10 Essential (primary) hypertension: Secondary | ICD-10-CM

## 2022-09-16 DIAGNOSIS — Z13220 Encounter for screening for lipoid disorders: Secondary | ICD-10-CM

## 2022-09-16 DIAGNOSIS — Z131 Encounter for screening for diabetes mellitus: Secondary | ICD-10-CM | POA: Diagnosis not present

## 2022-09-16 DIAGNOSIS — Z0000 Encounter for general adult medical examination without abnormal findings: Secondary | ICD-10-CM

## 2022-09-16 DIAGNOSIS — R87610 Atypical squamous cells of undetermined significance on cytologic smear of cervix (ASC-US): Secondary | ICD-10-CM | POA: Diagnosis present

## 2022-09-16 DIAGNOSIS — N760 Acute vaginitis: Secondary | ICD-10-CM

## 2022-09-16 DIAGNOSIS — Z1321 Encounter for screening for nutritional disorder: Secondary | ICD-10-CM

## 2022-09-16 NOTE — Assessment & Plan Note (Signed)
BP Readings from Last 3 Encounters:  09/16/22 134/86  12/27/21 (!) 149/90  02/08/21 133/88  Not on medications. DASH diet advised patient encouraged to engage in regular moderate to vigorous exercises at least 150 minutes weekly.

## 2022-09-16 NOTE — Assessment & Plan Note (Addendum)
ASCUS 2022 Cervical Pap exam done today without difficulty.  Sample sent today and

## 2022-09-16 NOTE — Assessment & Plan Note (Signed)
Wt Readings from Last 3 Encounters:  09/16/22 210 lb (95.3 kg)  02/08/21 208 lb 4.8 oz (94.5 kg)  11/13/20 208 lb (94.3 kg)   Body mass index is 37.2 kg/m.  Need to increase intake of whole food consisting mainly vegetables and protein less carbohydrate drinking at least 64 ounces of water daily, engaging in regular moderate to vigorous exercises at least 250 minutes weekly, importance of portion control also discussed.  Benefits of healthy weight discussed.  Follow-up in 3 months

## 2022-09-16 NOTE — Assessment & Plan Note (Signed)
Annual exam as documented.  Counseling done include healthy lifestyle involving committing to 150 minutes of exercise per week, heart healthy diet, and attaining healthy weight. The importance of adequate sleep also discussed.  Regular use of seat belt and home safety were also discussed . Changes in health habits are decided on by patient with goals and time frames set for achieving them. Immunization and cancer screening  needs are specifically addressed at this visit.   Cervical Pap exam completed today.  She was encouraged to consider getting flu vaccine.  Cologuard ordered to screen for colon cancer

## 2022-09-16 NOTE — Progress Notes (Signed)
New Patient Office Visit  Subjective    Patient ID: Valerie Rivera, female    DOB: March 24, 1976  Age: 46 y.o. MRN: [id number]  CC:  Chief Complaint  Patient presents with   papsmear    Pt stated--itching, burning and papsmear. Denied fever/ pain.    HPI [name] with past medical history of ASCUS with negative HPV obesity presents to establish care with new provider and for annual physical examination, patient stated that she does not exercise she also stated that she needs to do better with her diet.  She had an abnormal cervical exam a year ago , needs a repeat cervical Pap exam today  Has upcoming mammogram in February 2024.  She is due for flu vaccine flu vaccine declined today.  Cologuard ordered to screen for colon cancer.  Up-to-date with Tdap vaccine  Patient stated that about a week ago she had vaginal itching, white colored vaginal discharge.  She denies fever, chills, body aches abdominal pain.She is sexually active and  has 1 sexual partner   Outpatient Encounter Medications as of 09/16/2022  Medication Sig   fexofenadine (ALLEGRA) 60 MG tablet Take 60 mg by mouth 2 (two) times daily.   famotidine (PEPCID) 10 MG tablet Take 10 mg by mouth 2 (two) times daily. (Patient not taking: Reported on 09/16/2022)   ibuprofen (ADVIL) 800 MG tablet Take 1 tablet (800 mg total) by mouth every 8 (eight) hours as needed (pain). (Patient not taking: Reported on 09/16/2022)   mupirocin ointment (BACTROBAN) 2 % Apply 1 application. topically 2 (two) times daily. To affected area till better (Patient not taking: Reported on 09/16/2022)   No facility-administered encounter medications on file as of 09/16/2022.    Past Medical History:  Diagnosis Date   Blood transfusion without reported diagnosis    Hives     Past Surgical History:  Procedure Laterality Date   CESAREAN SECTION  12/29/2001, 12/16/2009    Family History  Adopted: Yes  Problem Relation Age of Onset    Heart disease Mother    Heart disease Maternal Uncle    Diabetes Mellitus II Paternal Grandmother    Breast cancer Neg Hx     Social History   Socioeconomic History   Marital status: Single    Spouse name: Not on file   Number of children: 2   Years of education: Not on file   Highest education level: Associate degree: academic program  Occupational History   Not on file  Tobacco Use   Smoking status: Never   Smokeless tobacco: Never  Vaping Use   Vaping Use: Never used  Substance and Sexual Activity   Alcohol use: Yes    Comment: occassionally   Drug use: No   Sexual activity: Yes    Birth control/protection: Condom  Other Topics Concern   Not on file  Social History Narrative   Lives with her daughter   Social Determinants of Health   Financial Resource Strain: Not on file  Food Insecurity: No Food Insecurity (02/08/2021)   Hunger Vital Sign    Worried About Running Out of Food in the Last Year: Never true    Gates in the Last Year: Never true  Transportation Needs: No Transportation Needs (02/08/2021)   PRAPARE - Hydrologist (Medical): No    Lack of Transportation (Non-Medical): No  Physical Activity: Not on file  Stress: Not on file  Social Connections: Not on file  Intimate Partner Violence: Not on file    Review of Systems  Constitutional: Negative.  Negative for chills, diaphoresis, fever, malaise/fatigue and weight loss.  HENT: Negative.  Negative for congestion, ear discharge, ear pain, hearing loss, nosebleeds, sinus pain and tinnitus.   Eyes: Negative.  Negative for blurred vision, double vision, photophobia, pain, discharge and redness.  Respiratory: Negative.  Negative for cough, hemoptysis, sputum production and shortness of breath.   Cardiovascular: Negative.  Negative for chest pain, palpitations, orthopnea, claudication and leg swelling.  Gastrointestinal:  Negative for abdominal pain, blood in stool,  constipation, diarrhea, heartburn, nausea and vomiting.  Genitourinary: Negative.  Negative for dysuria, flank pain, frequency, hematuria and urgency.  Musculoskeletal: Negative.  Negative for back pain, joint pain, myalgias and neck pain.  Skin: Negative.  Negative for itching and rash.  Neurological: Negative.  Negative for dizziness, tingling, tremors, sensory change, speech change and headaches.  Endo/Heme/Allergies: Negative.  Negative for polydipsia. Does not bruise/bleed easily.  Psychiatric/Behavioral:  Negative for depression, hallucinations, substance abuse and suicidal ideas. The patient is not nervous/anxious.         Objective    BP 134/86   Pulse 84   Temp 97.6 F (36.4 C)   Ht _0  (1.6 m)   Wt 210 lb (95.3 kg)   SpO2 96%   BMI 37.20 kg/m   Physical Exam Vitals and nursing note reviewed. Exam conducted with a chaperone present.  Constitutional:      General: She is not in acute distress.    Appearance: Normal appearance. She is obese. She is not ill-appearing, toxic-appearing or diaphoretic.  HENT:     Right Ear: Tympanic membrane, ear canal and external ear normal. There is no impacted cerumen.     Left Ear: Tympanic membrane, ear canal and external ear normal. There is no impacted cerumen.     Nose: Nose normal. No congestion or rhinorrhea.     Mouth/Throat:     Mouth: Mucous membranes are moist.     Pharynx: Oropharynx is clear. No oropharyngeal exudate or posterior oropharyngeal erythema.  Eyes:     General: No scleral icterus.       Right eye: No discharge.        Left eye: No discharge.     Extraocular Movements: Extraocular movements intact.     Conjunctiva/sclera: Conjunctivae normal.  Neck:     Vascular: No carotid bruit.  Cardiovascular:     Rate and Rhythm: Normal rate and regular rhythm.     Pulses: Normal pulses.     Heart sounds: Normal heart sounds. No murmur heard.    No friction rub. No gallop.  Pulmonary:     Effort: Pulmonary effort  is normal. No respiratory distress.     Breath sounds: Normal breath sounds. No stridor. No wheezing, rhonchi or rales.  Chest:     Chest wall: No mass, lacerations, deformity, swelling, tenderness, crepitus or edema.  Breasts:    Tanner Score is 5.     Breasts are symmetrical.     Right: Normal. No swelling, bleeding, inverted nipple, mass, nipple discharge, skin change or tenderness.     Left: Normal. No swelling, bleeding, inverted nipple, mass, nipple discharge, skin change or tenderness.  Abdominal:     General: There is no distension.     Palpations: Abdomen is soft. There is no mass.     Tenderness: There is no abdominal tenderness. There is no right CVA tenderness, left CVA tenderness, guarding or rebound.  Hernia: No hernia is present. There is no hernia in the left inguinal area or right inguinal area.  Genitourinary:    Exam position: Lithotomy position.     Pubic Area: No rash or pubic lice.      Tanner stage (genital): 5.     Labia:        Right: No rash, tenderness, lesion or injury.        Left: No rash, tenderness, lesion or injury.      Urethra: No prolapse, urethral pain, urethral swelling or urethral lesion.     Vagina: No signs of injury and foreign body. No vaginal discharge, erythema, tenderness, bleeding, lesions or prolapsed vaginal walls.     Cervix: No cervical motion tenderness, discharge, friability, lesion, erythema, cervical bleeding or eversion.     Uterus: Not deviated, not enlarged, not fixed, not tender and no uterine prolapse.      Adnexa:        Right: No mass, tenderness or fullness.         Left: No mass, tenderness or fullness.    Musculoskeletal:        General: No swelling, tenderness, deformity or signs of injury.     Cervical back: Normal range of motion and neck supple. No rigidity or tenderness.     Right lower leg: No edema.     Left lower leg: No edema.  Lymphadenopathy:     Cervical: No cervical adenopathy.     Upper Body:      Right upper body: No supraclavicular, axillary or pectoral adenopathy.     Left upper body: No supraclavicular, axillary or pectoral adenopathy.     Lower Body: No right inguinal adenopathy. No left inguinal adenopathy.  Skin:    General: Skin is warm and dry.     Capillary Refill: Capillary refill takes less than 2 seconds.     Coloration: Skin is not jaundiced or pale.     Findings: No bruising, erythema, lesion or rash.  Neurological:     Mental Status: She is alert and oriented to person, place, and time.     Cranial Nerves: No cranial nerve deficit.     Sensory: No sensory deficit.     Motor: No weakness.     Coordination: Coordination normal.     Gait: Gait normal.  Psychiatric:        Mood and Affect: Mood normal.        Behavior: Behavior normal.        Thought Content: Thought content normal.        Judgment: Judgment normal.         Assessment & Plan:   Problem List Items Addressed This Visit       Cardiovascular and Mediastinum   Hypertension    BP Readings from Last 3 Encounters:  09/16/22 134/86  12/27/21 (!) 149/90  02/08/21 133/88  Not on medications. DASH diet advised patient encouraged to engage in regular moderate to vigorous exercises at least 150 minutes weekly.         Other   ASCUS of cervix with negative high risk HPV    ASCUS 2022 Cervical Pap exam done today without difficulty.  Sample sent today and      Relevant Orders   Cytology - PAP([place])   Class 2 severe obesity due to excess calories with serious comorbidity in adult [facility]    Wt Readings from Last 3 Encounters:  09/16/22 210 lb (95.3 kg)  02/08/21  208 lb 4.8 oz (94.5 kg)  11/13/20 208 lb (94.3 kg)   Body mass index is 37.2 kg/m.  Need to increase intake of whole food consisting mainly vegetables and protein less carbohydrate drinking at least 64 ounces of water daily, engaging in regular moderate to vigorous exercises at least 250 minutes weekly, importance of portion  control also discussed.  Benefits of healthy weight discussed.  Follow-up in 3 months      Annual physical exam - Primary    Annual exam as documented.  Counseling done include healthy lifestyle involving committing to 150 minutes of exercise per week, heart healthy diet, and attaining healthy weight. The importance of adequate sleep also discussed.  Regular use of seat belt and home safety were also discussed . Changes in health habits are decided on by patient with goals and time frames set for achieving them. Immunization and cancer screening  needs are specifically addressed at this visit.   Cervical Pap exam completed today.  She was encouraged to consider getting flu vaccine.  Cologuard ordered to screen for colon cancer      Relevant Orders   CBC with Differential   CMP14+EGFR   Cytology - PAP([place])   Other Visit Diagnoses     Screening for diabetes mellitus (DM)       Relevant Orders   Hemoglobin A1c   Screening for colon cancer       Relevant Orders   Cologuard   Screening for thyroid disorder       Relevant Orders   TSH   Screening for lipid disorders       Relevant Orders   Lipid Panel   Encounter for vitamin deficiency screening       Relevant Orders   Vitamin D, 25-hydroxy   Acute vaginitis       Relevant Orders   NuSwab Vaginitis Plus (VG+)       Return in about 3 months (around 12/16/2022) for HTN/Obesity.   Renee Rival, FNP

## 2022-09-16 NOTE — Patient Instructions (Signed)
Please consider getting our flu vaccine.    Around 3 times per week, check your blood pressure 2 times per day. once in the morning and once in the evening. The readings should be at least one minute apart. Write down these values and bring them to your next nurse visit/appointment.  When you check your BP, make sure you have been doing something calm/relaxing 5 minutes prior to checking. Both feet should be flat on the floor and you should be sitting. Use your left arm and make sure it is in a relaxed position (on a table), and that the cuff is at the approximate level/height of your heart.    It is important that you exercise regularly at least 30 minutes 5 times a week as tolerated  Think about what you will eat, plan ahead. Choose " clean, green, fresh or frozen" over canned, processed or packaged foods which are more sugary, salty and fatty. 70 to 75% of food eaten should be vegetables and fruit. Three meals at set times with snacks allowed between meals, but they must be fruit or vegetables. Aim to eat over a 12 hour period , example 7 am to 7 pm, and STOP after  your last meal of the day. Drink water,generally about 64 ounces per day, no other drink is as healthy. Fruit juice is best enjoyed in a healthy way, by EATING the fruit.  Thanks for choosing [facility] we consider it a privelige to serve you.

## 2022-09-17 LAB — CBC WITH DIFFERENTIAL/PLATELET
Basophils Absolute: 0 10*3/uL (ref 0.0–0.2)
Basos: 0 %
EOS (ABSOLUTE): 0.1 10*3/uL (ref 0.0–0.4)
Eos: 1 %
Hematocrit: 35.8 % (ref 34.0–46.6)
Hemoglobin: 11.3 g/dL (ref 11.1–15.9)
Immature Grans (Abs): 0 10*3/uL (ref 0.0–0.1)
Immature Granulocytes: 0 %
Lymphocytes Absolute: 2.7 10*3/uL (ref 0.7–3.1)
Lymphs: 38 %
MCH: 27.8 pg (ref 26.6–33.0)
MCHC: 31.6 g/dL (ref 31.5–35.7)
MCV: 88 fL (ref 79–97)
Monocytes Absolute: 0.5 10*3/uL (ref 0.1–0.9)
Monocytes: 7 %
Neutrophils Absolute: 3.8 10*3/uL (ref 1.4–7.0)
Neutrophils: 54 %
Platelets: 427 10*3/uL (ref 150–450)
RBC: 4.06 x10E6/uL (ref 3.77–5.28)
RDW: 14.1 % (ref 11.7–15.4)
WBC: 7.1 10*3/uL (ref 3.4–10.8)

## 2022-09-17 LAB — HEMOGLOBIN A1C
Est. average glucose Bld gHb Est-mCnc: 108 mg/dL
Hgb A1c MFr Bld: 5.4 % (ref 4.8–5.6)

## 2022-09-17 LAB — CMP14+EGFR
ALT: 19 IU/L (ref 0–32)
AST: 21 IU/L (ref 0–40)
Albumin/Globulin Ratio: 1.3 (ref 1.2–2.2)
Albumin: 4.1 g/dL (ref 3.9–4.9)
Alkaline Phosphatase: 87 IU/L (ref 44–121)
BUN/Creatinine Ratio: 11 (ref 9–23)
BUN: 8 mg/dL (ref 6–24)
Bilirubin Total: 0.5 mg/dL (ref 0.0–1.2)
CO2: 21 mmol/L (ref 20–29)
Calcium: 9.2 mg/dL (ref 8.7–10.2)
Chloride: 102 mmol/L (ref 96–106)
Creatinine, Ser: 0.76 mg/dL (ref 0.57–1.00)
Globulin, Total: 3.2 g/dL (ref 1.5–4.5)
Glucose: 89 mg/dL (ref 70–99)
Potassium: 3.8 mmol/L (ref 3.5–5.2)
Sodium: 137 mmol/L (ref 134–144)
Total Protein: 7.3 g/dL (ref 6.0–8.5)
eGFR: 98 mL/min/{1.73_m2} (ref >59)

## 2022-09-17 LAB — LIPID PANEL
Chol/HDL Ratio: 4.3 ratio (ref 0.0–4.4)
Cholesterol, Total: 159 mg/dL (ref 100–199)
HDL: 37 mg/dL — ABNORMAL LOW (ref >39)
LDL Chol Calc (NIH): 102 mg/dL — ABNORMAL HIGH (ref 0–99)
Triglycerides: 107 mg/dL (ref 0–149)
VLDL Cholesterol Cal: 20 mg/dL (ref 5–40)

## 2022-09-17 LAB — VITAMIN D 25 HYDROXY (VIT D DEFICIENCY, FRACTURES): Vit D, 25-Hydroxy: 10.9 ng/mL — ABNORMAL LOW (ref 30.0–100.0)

## 2022-09-17 LAB — TSH: TSH: 1.11 u[IU]/mL (ref 0.450–4.500)

## 2022-09-21 ENCOUNTER — Other Ambulatory Visit: Payer: Self-pay | Admitting: Nurse Practitioner

## 2022-09-21 DIAGNOSIS — E559 Vitamin D deficiency, unspecified: Secondary | ICD-10-CM | POA: Insufficient documentation

## 2022-09-21 LAB — NUSWAB VAGINITIS PLUS (VG+)
Candida albicans, NAA: NEGATIVE
Candida glabrata, NAA: NEGATIVE
Chlamydia trachomatis, NAA: NEGATIVE
Neisseria gonorrhoeae, NAA: NEGATIVE
Trich vag by NAA: NEGATIVE

## 2022-09-21 LAB — CYTOLOGY - PAP: Diagnosis: NEGATIVE

## 2022-09-21 MED ORDER — VITAMIN D (ERGOCALCIFEROL) 1.25 MG (50000 UNIT) PO CAPS: 50000.0000 [IU] | ORAL_CAPSULE | ORAL | 0 refills | Status: DC

## 2022-09-21 NOTE — Progress Notes (Signed)
Normal test

## 2022-09-21 NOTE — Progress Notes (Signed)
Vitamin D deficiency. Take vitamin D 50,000 units once weekly for 8 weeks  Eat foods rich in Vitamin D like Cod liver oil,Salmon,Swordfish,Tuna fish,,Dairy and plant milks fortified with vitamin D,Sardines,Beef liver. Get early morning sunshine.     Hyperlipidemia. Eat a healthy diet, including lots of fruits and vegetables. Avoid foods with a lot of saturated and trans fats, such as red meat, butter, fried foods and cheese . Attain  a healthy weight.  Other labs are normal.

## 2022-10-20 ENCOUNTER — Telehealth: Payer: Self-pay

## 2022-10-20 NOTE — Telephone Encounter (Signed)
Spoke to pt and she will go ahead and have the titer done. Please place orders for future. Thank you. kh

## 2022-10-21 ENCOUNTER — Other Ambulatory Visit: Payer: Self-pay | Admitting: Nurse Practitioner

## 2022-10-21 DIAGNOSIS — Z020 Encounter for examination for admission to educational institution: Secondary | ICD-10-CM

## 2022-10-21 LAB — COLOGUARD: COLOGUARD: NEGATIVE

## 2022-10-21 NOTE — Progress Notes (Signed)
Normal test repeat in 3 years

## 2022-10-24 ENCOUNTER — Other Ambulatory Visit: Payer: Medicaid Other

## 2022-10-24 DIAGNOSIS — Z020 Encounter for examination for admission to educational institution: Secondary | ICD-10-CM

## 2022-10-25 LAB — MEASLES/MUMPS/RUBELLA IMMUNITY
MUMPS ABS, IGG: 33.8 AU/mL (ref >10.9)
RUBEOLA AB, IGG: <13.5 AU/mL — ABNORMAL LOW (ref >16.4)
Rubella Antibodies, IGG: 2.11 index (ref >0.99)

## 2022-10-25 LAB — HEPATITIS B SURFACE ANTIBODY,QUALITATIVE: Hep B Surface Ab, Qual: NONREACTIVE

## 2022-10-25 NOTE — Progress Notes (Signed)
Please advise patient to get Hep B vaccine and  MMR vaccine because her blood work does not demonstrate immunity to hep B and Rubeola.

## 2022-10-26 ENCOUNTER — Telehealth: Payer: Self-pay | Admitting: Nurse Practitioner

## 2022-10-26 NOTE — Telephone Encounter (Signed)
Pt called and needs results...she didn't specify which results on voice mail

## 2022-10-26 NOTE — Telephone Encounter (Signed)
Results have been relayed to the patient. The patient verbalized understanding. No questions at this time.   

## 2022-11-09 ENCOUNTER — Ambulatory Visit
Admission: RE | Admit: 2022-11-09 | Discharge: 2022-11-09 | Disposition: A | Discharge status: Home / Self Care | Payer: Medicaid Other | Source: Ambulatory Visit | Attending: Nurse Practitioner | Admitting: Nurse Practitioner

## 2022-11-09 DIAGNOSIS — Z1231 Encounter for screening mammogram for malignant neoplasm of breast: Secondary | ICD-10-CM

## 2022-11-10 ENCOUNTER — Telehealth: Payer: Self-pay

## 2022-11-10 NOTE — Telephone Encounter (Signed)
Pt wa advised that paper work is done and she can come by and pick it up.   Elyse Jarvis RMA

## 2022-11-18 NOTE — Progress Notes (Signed)
There are no findings suspicious for malignancy.

## 2022-12-16 ENCOUNTER — Ambulatory Visit: Payer: Self-pay | Admitting: Nurse Practitioner

## 2022-12-19 ENCOUNTER — Ambulatory Visit (INDEPENDENT_AMBULATORY_CARE_PROVIDER_SITE_OTHER): Payer: Medicaid Other | Admitting: Nurse Practitioner

## 2022-12-19 ENCOUNTER — Encounter: Payer: Self-pay | Admitting: Nurse Practitioner

## 2022-12-19 VITALS — BP 127/72 | HR 81 | Temp 97.8°F | Ht 63.0 in | Wt 207.8 lb

## 2022-12-19 DIAGNOSIS — E66812 Obesity, class 2: Secondary | ICD-10-CM

## 2022-12-19 DIAGNOSIS — E559 Vitamin D deficiency, unspecified: Secondary | ICD-10-CM | POA: Diagnosis not present

## 2022-12-19 DIAGNOSIS — I10 Essential (primary) hypertension: Secondary | ICD-10-CM

## 2022-12-19 DIAGNOSIS — Z6835 Body mass index (BMI) 35.0-35.9, adult: Secondary | ICD-10-CM

## 2022-12-19 NOTE — Assessment & Plan Note (Signed)
Checking vitamin D levels today. 

## 2022-12-19 NOTE — Patient Instructions (Signed)

## 2022-12-19 NOTE — Progress Notes (Signed)
Established Patient Office Visit  Subjective:  Patient ID: Valerie Rivera, female    DOB: [birth year]  Age: 48 y.o. MRN: [id number]  CC:  Chief Complaint  Patient presents with   Follow-up    HTN.    HPI Valerie Rivera is a 47 y.o. female  has a past medical history of Blood transfusion without reported diagnosis and Hives.  Vitamin D deficiency, obesity who presents for follow-up for her vitamin D deficiency.   Vitamin D deficiency.  She was on vitamin D 50,000 units once weekly patient denies fatigue, malaise, chest pain, shortness of breath, dizziness.     Past Medical History:  Diagnosis Date   Blood transfusion without reported diagnosis    Hives     Past Surgical History:  Procedure Laterality Date   CESAREAN SECTION  12/29/2001, 12/16/2009    Family History  Adopted: Yes  Problem Relation Age of Onset   Heart disease Mother    Heart disease Maternal Uncle    Diabetes Mellitus II Paternal Grandmother    Breast cancer Neg Hx     Social History   Socioeconomic History   Marital status: Single    Spouse name: Not on file   Number of children: 2   Years of education: Not on file   Highest education level: Associate degree: academic program  Occupational History   Not on file  Tobacco Use   Smoking status: Never   Smokeless tobacco: Never  Vaping Use   Vaping Use: Never used  Substance and Sexual Activity   Alcohol use: Yes    Comment: occassionally   Drug use: No   Sexual activity: Yes    Birth control/protection: Condom  Other Topics Concern   Not on file  Social History Narrative   Lives with her daughter   Social Determinants of Health   Financial Resource Strain: Not on file  Food Insecurity: No Food Insecurity (12/19/2022)   Hunger Vital Sign    Worried About Running Out of Food in the Last Year: Never true    Ran Out of Food in the Last Year: Never true  Transportation Needs: No Transportation Needs (12/19/2022)   PRAPARE -  Hydrologist (Medical): No    Lack of Transportation (Non-Medical): No  Physical Activity: Not on file  Stress: Not on file  Social Connections: Not on file  Intimate Partner Violence: Not At Risk (12/19/2022)   Humiliation, Afraid, Rape, and Kick questionnaire    Fear of Current or Ex-Partner: No    Emotionally Abused: No    Physically Abused: No    Sexually Abused: No    Outpatient Medications Prior to Visit  Medication Sig Dispense Refill   fexofenadine (ALLEGRA) 60 MG tablet Take 60 mg by mouth 2 (two) times daily.     Vitamin D, Ergocalciferol, (DRISDOL) 1.25 MG (50000 UNIT) CAPS capsule Take 1 capsule (50,000 Units total) by mouth every 7 (seven) days. 8 capsule 0   famotidine (PEPCID) 10 MG tablet Take 10 mg by mouth 2 (two) times daily. (Patient not taking: Reported on 09/16/2022)     ibuprofen (ADVIL) 800 MG tablet Take 1 tablet (800 mg total) by mouth every 8 (eight) hours as needed (pain). (Patient not taking: Reported on 09/16/2022) 21 tablet 0   mupirocin ointment (BACTROBAN) 2 % Apply 1 application. topically 2 (two) times daily. To affected area till better (Patient not taking: Reported on 09/16/2022) 22 g 0   No  facility-administered medications prior to visit.    No Known Allergies  ROS Review of Systems  Constitutional: Negative.   Respiratory: Negative.    Cardiovascular: Negative.   Gastrointestinal: Negative.   Genitourinary: Negative.   Musculoskeletal: Negative.   Neurological: Negative.   Psychiatric/Behavioral: Negative.        Objective:    Physical Exam Constitutional:      General: She is not in acute distress.    Appearance: She is obese. She is not ill-appearing, toxic-appearing or diaphoretic.  Eyes:     General: No scleral icterus.       Right eye: No discharge.        Left eye: No discharge.     Extraocular Movements: Extraocular movements intact.  Cardiovascular:     Rate and Rhythm: Normal rate and  regular rhythm.     Pulses: Normal pulses.     Heart sounds: Normal heart sounds. No murmur heard.    No friction rub. No gallop.  Pulmonary:     Effort: Pulmonary effort is normal. No respiratory distress.     Breath sounds: No stridor. No wheezing, rhonchi or rales.  Chest:     Chest wall: No tenderness.  Abdominal:     General: There is no distension.     Tenderness: There is no abdominal tenderness.  Musculoskeletal:        General: No swelling, tenderness, deformity or signs of injury. Normal range of motion.     Right lower leg: No edema.     Left lower leg: No edema.  Skin:    Capillary Refill: Capillary refill takes less than 2 seconds.     Coloration: Skin is not jaundiced or pale.     Findings: No bruising, erythema or lesion.  Neurological:     Mental Status: She is alert and oriented to person, place, and time.     Motor: No weakness.     Coordination: Coordination normal.     Gait: Gait normal.  Psychiatric:        Mood and Affect: Mood normal.        Behavior: Behavior normal.        Thought Content: Thought content normal.        Judgment: Judgment normal.     BP 127/72   Pulse 81   Temp 97.8 F (36.6 C)   Ht 5\' 3"  (1.6 m)   Wt 207 lb 12.8 oz (94.3 kg)   LMP 12/12/2022 (Approximate)   SpO2 100%   BMI 36.81 kg/m  Wt Readings from Last 3 Encounters:  12/19/22 207 lb 12.8 oz (94.3 kg)  09/16/22 210 lb (95.3 kg)  02/08/21 208 lb 4.8 oz (94.5 kg)    Lab Results  Component Value Date   TSH 1.110 09/16/2022   Lab Results  Component Value Date   WBC 7.1 09/16/2022   HGB 11.3 09/16/2022   HCT 35.8 09/16/2022   MCV 88 09/16/2022   PLT 427 09/16/2022   Lab Results  Component Value Date   NA 137 09/16/2022   K 3.8 09/16/2022   CO2 21 09/16/2022   GLUCOSE 89 09/16/2022   BUN 8 09/16/2022   CREATININE 0.76 09/16/2022   BILITOT 0.5 09/16/2022   ALKPHOS 87 09/16/2022   AST 21 09/16/2022   ALT 19 09/16/2022   PROT 7.3 09/16/2022   ALBUMIN 4.1  09/16/2022   CALCIUM 9.2 09/16/2022   EGFR 98 09/16/2022   Lab Results  Component Value Date  CHOL 159 09/16/2022   Lab Results  Component Value Date   HDL 37 (L) 09/16/2022   Lab Results  Component Value Date   LDLCALC 102 (H) 09/16/2022   Lab Results  Component Value Date   TRIG 107 09/16/2022   Lab Results  Component Value Date   CHOLHDL 4.3 09/16/2022   Lab Results  Component Value Date   HGBA1C 5.4 09/16/2022      Assessment & Plan:   Problem List Items Addressed This Visit       Cardiovascular and Mediastinum   Hypertension    BP Readings from Last 3 Encounters:  12/19/22 127/72  09/16/22 134/86  12/27/21 (!) 149/90   HTN Controlled not on medications Discussed DASH diet and dietary sodium restrictions Continue to increase dietary efforts and exercise.           Other   Class 2 severe obesity due to excess calories with serious comorbidity in adult [facility])    Walking exercises daily  , doing better on diet.  She has 3 pounds since her last visit Patient counseled on low-carb modified diet She was encouraged to engage in regular moderate to vigorous exercises at least 150 minutes weekly.  Wt Readings from Last 3 Encounters:  12/19/22 207 lb 12.8 oz (94.3 kg)  09/16/22 210 lb (95.3 kg)  02/08/21 208 lb 4.8 oz (94.5 kg)         Vitamin D deficiency - Primary    Checking vitamin D levels today      Relevant Orders   Vitamin D, 25-hydroxy    No orders of the defined types were placed in this encounter.   Follow-up: Return in about 9 months (around 09/20/2023) for CPE.    Renee Rival, FNP

## 2022-12-19 NOTE — Assessment & Plan Note (Addendum)
Walking exercises daily  , doing better on diet.  She has 3 pounds since her last visit Patient counseled on low-carb modified diet She was encouraged to engage in regular moderate to vigorous exercises at least 150 minutes weekly.  Wt Readings from Last 3 Encounters:  12/19/22 207 lb 12.8 oz (94.3 kg)  09/16/22 210 lb (95.3 kg)  02/08/21 208 lb 4.8 oz (94.5 kg)

## 2022-12-19 NOTE — Assessment & Plan Note (Addendum)
BP Readings from Last 3 Encounters:  12/19/22 127/72  09/16/22 134/86  12/27/21 (!) 149/90   HTN Controlled not on medications Discussed DASH diet and dietary sodium restrictions Continue to increase dietary efforts and exercise.

## 2022-12-20 LAB — VITAMIN D 25 HYDROXY (VIT D DEFICIENCY, FRACTURES): Vit D, 25-Hydroxy: 29 ng/mL — ABNORMAL LOW (ref 30.0–100.0)

## 2023-06-06 ENCOUNTER — Encounter: Payer: Self-pay | Admitting: Nurse Practitioner

## 2023-06-06 ENCOUNTER — Ambulatory Visit (HOSPITAL_COMMUNITY)
Admission: RE | Admit: 2023-06-06 | Discharge: 2023-06-06 | Disposition: A | Discharge status: Home / Self Care | Payer: Medicaid Other | Source: Ambulatory Visit | Attending: Nurse Practitioner | Admitting: Nurse Practitioner

## 2023-06-06 ENCOUNTER — Ambulatory Visit: Payer: Medicaid Other | Admitting: Nurse Practitioner

## 2023-06-06 VITALS — BP 130/67 | HR 74 | Temp 98.5°F | Resp 12 | Ht 63.0 in | Wt 202.0 lb

## 2023-06-06 DIAGNOSIS — N62 Hypertrophy of breast: Secondary | ICD-10-CM | POA: Diagnosis not present

## 2023-06-06 DIAGNOSIS — Z6835 Body mass index (BMI) 35.0-35.9, adult: Secondary | ICD-10-CM

## 2023-06-06 DIAGNOSIS — M549 Dorsalgia, unspecified: Secondary | ICD-10-CM | POA: Diagnosis not present

## 2023-06-06 DIAGNOSIS — G8929 Other chronic pain: Secondary | ICD-10-CM | POA: Insufficient documentation

## 2023-06-06 DIAGNOSIS — M722 Plantar fascial fibromatosis: Secondary | ICD-10-CM | POA: Insufficient documentation

## 2023-06-06 MED ORDER — IBUPROFEN 800 MG PO TABS: 800.0000 mg | ORAL_TABLET | Freq: Three times a day (TID) | ORAL | 0 refills | Status: AC | PRN

## 2023-06-06 NOTE — Progress Notes (Signed)
Acute Office Visit  Subjective:     Patient ID: SEE OMALLEY, female    DOB: 21-Apr-1976, 46 y.o.   MRN: [id number]  Chief Complaint  Patient presents with   Pain    HPI [name]  has a past medical history of Blood transfusion without reported diagnosis, Hives, Obesity, and Vitamin D deficiency.    Right foot pain. patient presents with complaints of right foot pain for the past 4 weeks, states that she has to hop on her right foot when she wakes up in the morning, pain eases off as the day goes on but always has soreness in the foot. She had started exercising more in the past 2 months but has not been able to exercise  since the pain started.  She denies fever, chills,  redness swelling no numbness or tingling.  She has not used any medication for her pain.   Macromastia.  Patient complaining of macromastia, states that her bra digs into her shoulders, has pain mainly in her middle back to her lower back when walking.  She is considering a breast reduction surgery.  Flu vaccine declined today.  Patient encouraged to consider getting the vaccine  Review of Systems  Constitutional:  Negative for activity change, appetite change, chills, fatigue and fever.  HENT:  Negative for congestion, dental problem, ear discharge, ear pain, hearing loss, rhinorrhea, sinus pressure, sinus pain, sneezing and sore throat.   Eyes:  Negative for pain, discharge, redness and itching.  Respiratory:  Negative for cough, chest tightness, shortness of breath and wheezing.   Cardiovascular:  Negative for chest pain, palpitations and leg swelling.  Gastrointestinal:  Negative for abdominal distention, abdominal pain, anal bleeding, blood in stool, constipation, diarrhea, nausea, rectal pain and vomiting.  Endocrine: Negative for cold intolerance, heat intolerance, polydipsia, polyphagia and polyuria.  Genitourinary:  Negative for difficulty urinating, dysuria, flank pain, frequency, hematuria,  menstrual problem, pelvic pain and vaginal bleeding.  Musculoskeletal:  Positive for arthralgias and back pain. Negative for gait problem, joint swelling and myalgias.  Skin:  Negative for color change, pallor, rash and wound.  Allergic/Immunologic: Negative for environmental allergies, food allergies and immunocompromised state.  Neurological:  Negative for dizziness, tremors, facial asymmetry, weakness and headaches.  Hematological:  Negative for adenopathy. Does not bruise/bleed easily.  Psychiatric/Behavioral:  Negative for agitation, behavioral problems, confusion, decreased concentration, hallucinations, self-injury and suicidal ideas.         Objective:    BP 130/67   Pulse 74   Temp 98.5 F (36.9 C)   Resp 12   Ht 5\' 3"  (1.6 m)   Wt 202 lb (91.6 kg)   LMP 05/08/2023   SpO2 100%   BMI 35.78 kg/m    Physical Exam Vitals and nursing note reviewed.  Constitutional:      General: She is not in acute distress.    Appearance: Normal appearance. She is obese. She is not ill-appearing, toxic-appearing or diaphoretic.  HENT:     Mouth/Throat:     Mouth: Mucous membranes are moist.     Pharynx: Oropharynx is clear. No oropharyngeal exudate or posterior oropharyngeal erythema.  Eyes:     General: No scleral icterus.       Right eye: No discharge.        Left eye: No discharge.     Extraocular Movements: Extraocular movements intact.     Conjunctiva/sclera: Conjunctivae normal.  Cardiovascular:     Rate and Rhythm: Normal rate and regular rhythm.  Pulses: Normal pulses.     Heart sounds: Normal heart sounds. No murmur heard.    No friction rub. No gallop.  Pulmonary:     Effort: Pulmonary effort is normal. No respiratory distress.     Breath sounds: Normal breath sounds. No stridor. No wheezing, rhonchi or rales.  Chest:     Chest wall: No tenderness.  Abdominal:     General: There is no distension.     Palpations: Abdomen is soft.     Tenderness: There is no  abdominal tenderness. There is no right CVA tenderness, left CVA tenderness or guarding.  Musculoskeletal:        General: No swelling, tenderness, deformity or signs of injury.     Right lower leg: No edema.     Left lower leg: No edema.     Comments: Tenderness on palpation of right foot around the heel.  No redness swelling noted.  Has palpable pedal pulses.  Skin warm and dry  Skin:    General: Skin is warm and dry.     Capillary Refill: Capillary refill takes less than 2 seconds.     Coloration: Skin is not jaundiced or pale.     Findings: No bruising, erythema or lesion.  Neurological:     Mental Status: She is alert and oriented to person, place, and time.     Motor: No weakness.     Coordination: Coordination normal.     Gait: Gait normal.  Psychiatric:        Mood and Affect: Mood normal.        Behavior: Behavior normal.        Thought Content: Thought content normal.        Judgment: Judgment normal.     No results found for any visits on 06/06/23.      Assessment & Plan:   Problem List Items Addressed This Visit       Musculoskeletal and Integument   Plantar fasciitis, right - Primary    1. Plantar fasciitis, right  - DG Foot Complete Right - ibuprofen (ADVIL) 800 MG tablet; Take 1 tablet (800 mg total) by mouth every 8 (eight) hours as needed (pain).  Dispense: 21 tablet; Refill: 0 We discussed the use of night splints, ice and wearing well-padded shoe to prevent pain. Educational material on plantar fasciitis provided       Relevant Medications   ibuprofen (ADVIL) 800 MG tablet   Other Relevant Orders   DG Foot Complete Right     Other   Class 2 severe obesity due to excess calories with serious comorbidity in adult (HCC)    Wt Readings from Last 3 Encounters:  06/06/23 202 lb (91.6 kg)  12/19/22 207 lb 12.8 oz (94.3 kg)  09/16/22 210 lb (95.3 kg)   Body mass index is 35.78 kg/m.  Patient has lost 5 pounds since her last visit, she has been  eating less meals eating more fruits and vegetables.  She has also started exercising more Patient counseled on low-carb modified diet Encouraged to engage in regular moderate to vigorous exercise at least 150 minutes weekly Benefits of healthy weights discussed Patient is not interested in medications for treatment of her obesity         Upper back pain, chronic    Patient is considering breast reduction surgery Has back pain worse with exercises due to her macromastia We discussed referral to physical therapy today      Relevant Medications  ibuprofen (ADVIL) 800 MG tablet   Other Relevant Orders   Ambulatory referral to Physical Therapy   Macromastia    Patient is considering breast reduction surgery Has back pain worse with exercises due to her macromastia We discussed referral to physical therapy today       Meds ordered this encounter  Medications   ibuprofen (ADVIL) 800 MG tablet    Sig: Take 1 tablet (800 mg total) by mouth every 8 (eight) hours as needed (pain).    Dispense:  21 tablet    Refill:  0    No follow-ups on file.  Donell Beers, FNP

## 2023-06-06 NOTE — Assessment & Plan Note (Signed)
1. Plantar fasciitis, right  - DG Foot Complete Right - ibuprofen (ADVIL) 800 MG tablet; Take 1 tablet (800 mg total) by mouth every 8 (eight) hours as needed (pain).  Dispense: 21 tablet; Refill: 0 We discussed the use of night splints, ice and wearing well-padded shoe to prevent pain. Educational material on plantar fasciitis provided

## 2023-06-06 NOTE — Assessment & Plan Note (Addendum)
Wt Readings from Last 3 Encounters:  06/06/23 202 lb (91.6 kg)  12/19/22 207 lb 12.8 oz (94.3 kg)  09/16/22 210 lb (95.3 kg)   Body mass index is 35.78 kg/m.  Patient has lost 5 pounds since her last visit, she has been eating less meals eating more fruits and vegetables.  She has also started exercising more Patient counseled on low-carb modified diet Encouraged to engage in regular moderate to vigorous exercise at least 150 minutes weekly Benefits of healthy weights discussed Patient is not interested in medications for treatment of her obesity

## 2023-06-06 NOTE — Progress Notes (Signed)
Pt is here for foot pain   Complaining of right heel pain started X2 months ago

## 2023-06-06 NOTE — Patient Instructions (Signed)
1. Plantar fasciitis, right  - DG Foot Complete Right - ibuprofen (ADVIL) 800 MG tablet; Take 1 tablet (800 mg total) by mouth every 8 (eight) hours as needed (pain).  Dispense: 21 tablet; Refill: 0  2. Macromastia   3. Upper back pain, chronic  - Ambulatory referral to Physical Therapy    It is important that you exercise regularly at least 30 minutes 5 times a week as tolerated  Think about what you will eat, plan ahead. Choose " clean, green, fresh or frozen" over canned, processed or packaged foods which are more sugary, salty and fatty. 70 to 75% of food eaten should be vegetables and fruit. Three meals at set times with snacks allowed between meals, but they must be fruit or vegetables. Aim to eat over a 12 hour period , example 7 am to 7 pm, and STOP after  your last meal of the day. Drink water,generally about 64 ounces per day, no other drink is as healthy. Fruit juice is best enjoyed in a healthy way, by EATING the fruit.  Thanks for choosing [facility] we consider it a privelige to serve you.

## 2023-06-06 NOTE — Assessment & Plan Note (Signed)
Patient is considering breast reduction surgery Has back pain worse with exercises due to her macromastia We discussed referral to physical therapy today

## 2023-06-16 ENCOUNTER — Other Ambulatory Visit: Payer: Self-pay | Admitting: Nurse Practitioner

## 2023-06-16 DIAGNOSIS — M79671 Pain in right foot: Secondary | ICD-10-CM

## 2023-06-16 DIAGNOSIS — M722 Plantar fascial fibromatosis: Secondary | ICD-10-CM

## 2023-06-26 ENCOUNTER — Ambulatory Visit: Payer: Medicaid Other

## 2023-06-26 ENCOUNTER — Encounter: Payer: Self-pay | Admitting: Podiatry

## 2023-06-26 ENCOUNTER — Ambulatory Visit: Payer: Medicaid Other | Admitting: Podiatry

## 2023-06-26 VITALS — BP 143/85 | HR 75

## 2023-06-26 DIAGNOSIS — M722 Plantar fascial fibromatosis: Secondary | ICD-10-CM

## 2023-06-26 DIAGNOSIS — M779 Enthesopathy, unspecified: Secondary | ICD-10-CM

## 2023-06-26 MED ORDER — TRIAMCINOLONE ACETONIDE 10 MG/ML IJ SUSP
10.0000 mg | Freq: Once | INTRAMUSCULAR | Status: AC
Start: 2023-06-26 — End: 2023-06-26
  Administered 2023-06-26: 10 mg via INTRA_ARTICULAR

## 2023-06-26 NOTE — Patient Instructions (Signed)

## 2023-06-26 NOTE — Progress Notes (Signed)
Subjective:   Patient ID: Valerie Rivera, female   DOB: 47 y.o.   MRN: [id number]   HPI Patient presents with pain in the right heel of 5 weeks duration.  States it has been very tender slightly better but still very sore with pressure.  Patient does not smoke likes to be active   Review of Systems  All other systems reviewed and are negative.       Objective:  Physical Exam Vitals and nursing note reviewed.  Constitutional:      Appearance: She is well-developed.  Pulmonary:     Effort: Pulmonary effort is normal.  Musculoskeletal:        General: Normal range of motion.  Skin:    General: Skin is warm.  Neurological:     Mental Status: She is alert.     Neurovascular status intact muscle strength found to be adequate range of motion within normal limits with exquisite discomfort plantar aspect right heel at the insertional point tendon calcaneus in the medial and central portion of the tendon.  Patient is found to have good digital perfusion well-oriented x 3     Assessment:  Acute plantar fasciitis right inflammation fluid medial band     Plan:  H&P reviewed sterile prep injected the fascia at insertion 3 mg Kenalog 5 mg Xylocaine explained condition discussed physical therapy stretching and shoe gear modifications reappoint as symptoms indicate  X-rays indicate minimal spur formation plantar slight spurring posterior no other pathology noted

## 2023-07-11 ENCOUNTER — Ambulatory Visit: Payer: Medicaid Other | Admitting: Nurse Practitioner

## 2023-07-11 ENCOUNTER — Encounter: Payer: Self-pay | Admitting: Nurse Practitioner

## 2023-07-11 VITALS — BP 131/77 | HR 74 | Ht 63.0 in | Wt 201.0 lb

## 2023-07-11 DIAGNOSIS — N939 Abnormal uterine and vaginal bleeding, unspecified: Secondary | ICD-10-CM | POA: Diagnosis not present

## 2023-07-11 NOTE — Patient Instructions (Signed)
1. Abnormal uterine bleeding  - CBC - US PELVIC COMPLETE WITH TRANSVAGINAL; Future    It is important that you exercise regularly at least 30 minutes 5 times a week as tolerated  Think about what you will eat, plan ahead. Choose " clean, green, fresh or frozen" over canned, processed or packaged foods which are more sugary, salty and fatty. 70 to 75% of food eaten should be vegetables and fruit. Three meals at set times with snacks allowed between meals, but they must be fruit or vegetables. Aim to eat over a 12 hour period , example 7 am to 7 pm, and STOP after  your last meal of the day. Drink water,generally about 64 ounces per day, no other drink is as healthy. Fruit juice is best enjoyed in a healthy way, by EATING the fruit.  Thanks for choosing [facility] we consider it a privelige to serve you.

## 2023-07-11 NOTE — Progress Notes (Signed)
Acute Office Visit  Subjective:     Patient ID: Valerie Rivera, female    DOB: 1976-01-20, 47 y.o.   MRN: [id number]  Chief Complaint  Patient presents with   Menstrual Problem    Cycle started 06/19/23, has not lessened, increased clots, denies abd pain, does report fatigue; requesting pelvic exam and [place]; requesting hormone level labs to check menopause status    HPI Valerie Rivera  has a past medical history of Blood transfusion without reported diagnosis, Hives, Obesity, and Vitamin D deficiency. Patient is in today for  menstural bleeding for 3 weeks, she reports normal flow sometimes passes clots. No pelvic pain  pain but feels tired.  States that she has noticed changes in her menstrual cycles recently as her bleeding is sometimes heavy, sees spotting sometimes. Has menses every month. She denies fever, abdominal pain, nausea, vomiting,.  She is up-to-date with Pap smear, last Pap was normal.     Review of Systems  Constitutional:  Negative for appetite change, chills, fatigue and fever.  HENT:  Negative for congestion, postnasal drip, rhinorrhea and sneezing.   Respiratory:  Negative for cough, shortness of breath and wheezing.   Cardiovascular:  Negative for chest pain, palpitations and leg swelling.  Gastrointestinal:  Negative for abdominal pain, constipation, nausea and vomiting.  Genitourinary:  Positive for menstrual problem and vaginal bleeding. Negative for difficulty urinating, dysuria, flank pain, frequency, pelvic pain and urgency.  Musculoskeletal:  Negative for arthralgias, back pain, joint swelling and myalgias.  Skin:  Negative for color change, pallor, rash and wound.  Neurological:  Negative for dizziness, facial asymmetry, weakness, numbness and headaches.  Psychiatric/Behavioral:  Negative for behavioral problems, confusion, self-injury and suicidal ideas.         Objective:    BP 131/77   Pulse 74   Ht 5\' 3"  (1.6 m)   Wt 201 lb (91.2 kg)   LMP  06/19/2023 (Exact Date)   SpO2 100%   BMI 35.61 kg/m    Physical Exam Vitals and nursing note reviewed.  Constitutional:      General: She is not in acute distress.    Appearance: Normal appearance. She is obese. She is not ill-appearing, toxic-appearing or diaphoretic.  HENT:     Mouth/Throat:     Mouth: Mucous membranes are moist.     Pharynx: Oropharynx is clear. No oropharyngeal exudate or posterior oropharyngeal erythema.  Eyes:     General: No scleral icterus.       Right eye: No discharge.        Left eye: No discharge.     Extraocular Movements: Extraocular movements intact.     Conjunctiva/sclera: Conjunctivae normal.  Cardiovascular:     Rate and Rhythm: Normal rate and regular rhythm.     Pulses: Normal pulses.     Heart sounds: Normal heart sounds. No murmur heard.    No friction rub. No gallop.  Pulmonary:     Effort: Pulmonary effort is normal. No respiratory distress.     Breath sounds: Normal breath sounds. No stridor. No wheezing, rhonchi or rales.  Chest:     Chest wall: No tenderness.  Abdominal:     General: There is no distension.     Palpations: Abdomen is soft.     Tenderness: There is no abdominal tenderness. There is no right CVA tenderness, left CVA tenderness or guarding.  Musculoskeletal:        General: No swelling, tenderness, deformity or signs of injury.  Right lower leg: No edema.     Left lower leg: No edema.  Skin:    General: Skin is warm and dry.     Capillary Refill: Capillary refill takes less than 2 seconds.     Coloration: Skin is not jaundiced or pale.     Findings: No bruising, erythema or lesion.  Neurological:     Mental Status: She is alert and oriented to person, place, and time.     Motor: No weakness.     Coordination: Coordination normal.     Gait: Gait normal.  Psychiatric:        Mood and Affect: Mood normal.        Behavior: Behavior normal.        Thought Content: Thought content normal.        Judgment:  Judgment normal.     No results found for any visits on 07/11/23.      Assessment & Plan:   Problem List Items Addressed This Visit       Genitourinary   Abnormal uterine bleeding - Primary    1. Abnormal uterine bleeding Checking - CBC - US PELVIC COMPLETE WITH TRANSVAGINAL; Future       Relevant Orders   CBC   US PELVIC COMPLETE WITH TRANSVAGINAL    No orders of the defined types were placed in this encounter.   No follow-ups on file.  Donell Beers, FNP

## 2023-07-11 NOTE — Assessment & Plan Note (Signed)
1. Abnormal uterine bleeding Checking - CBC - US PELVIC COMPLETE WITH TRANSVAGINAL; Future

## 2023-07-12 ENCOUNTER — Other Ambulatory Visit: Payer: Self-pay | Admitting: Nurse Practitioner

## 2023-07-12 ENCOUNTER — Inpatient Hospital Stay
Admission: RE | Admit: 2023-07-12 | Discharge: 2023-07-12 | Discharge status: Home / Self Care | Payer: Medicaid Other | Source: Ambulatory Visit | Attending: Nurse Practitioner

## 2023-07-12 DIAGNOSIS — D649 Anemia, unspecified: Secondary | ICD-10-CM

## 2023-07-12 DIAGNOSIS — N939 Abnormal uterine and vaginal bleeding, unspecified: Secondary | ICD-10-CM

## 2023-07-12 LAB — CBC
Hematocrit: 32 % — ABNORMAL LOW (ref 34.0–46.6)
Hemoglobin: 9.5 g/dL — ABNORMAL LOW (ref 11.1–15.9)
MCH: 24.2 pg — ABNORMAL LOW (ref 26.6–33.0)
MCHC: 29.7 g/dL — ABNORMAL LOW (ref 31.5–35.7)
MCV: 82 fL (ref 79–97)
Platelets: 435 10*3/uL (ref 150–450)
RBC: 3.92 x10E6/uL (ref 3.77–5.28)
RDW: 16 % — ABNORMAL HIGH (ref 11.7–15.4)
WBC: 8.7 10*3/uL (ref 3.4–10.8)

## 2023-07-27 ENCOUNTER — Encounter: Payer: Self-pay | Admitting: *Deleted

## 2023-11-09 ENCOUNTER — Other Ambulatory Visit: Payer: Self-pay | Admitting: Nurse Practitioner

## 2023-11-09 DIAGNOSIS — Z0000 Encounter for general adult medical examination without abnormal findings: Secondary | ICD-10-CM

## 2023-11-22 ENCOUNTER — Ambulatory Visit
Admission: RE | Admit: 2023-11-22 | Discharge: 2023-11-22 | Disposition: A | Discharge status: Home / Self Care | Payer: Medicaid Other | Source: Ambulatory Visit | Attending: Nurse Practitioner

## 2023-11-22 DIAGNOSIS — Z0000 Encounter for general adult medical examination without abnormal findings: Secondary | ICD-10-CM

## 2024-05-15 ENCOUNTER — Ambulatory Visit (INDEPENDENT_AMBULATORY_CARE_PROVIDER_SITE_OTHER): Admitting: Nurse Practitioner

## 2024-05-15 VITALS — BP 129/65 | HR 81 | Wt 210.0 lb

## 2024-05-15 DIAGNOSIS — Z130 Encounter for screening for diseases of the blood and blood-forming organs and certain disorders involving the immune mechanism: Secondary | ICD-10-CM

## 2024-05-15 DIAGNOSIS — Z1329 Encounter for screening for other suspected endocrine disorder: Secondary | ICD-10-CM

## 2024-05-15 DIAGNOSIS — E66812 Obesity, class 2: Secondary | ICD-10-CM | POA: Diagnosis not present

## 2024-05-15 DIAGNOSIS — Z6837 Body mass index (BMI) 37.0-37.9, adult: Secondary | ICD-10-CM

## 2024-05-15 DIAGNOSIS — D649 Anemia, unspecified: Secondary | ICD-10-CM | POA: Insufficient documentation

## 2024-05-15 DIAGNOSIS — Z0000 Encounter for general adult medical examination without abnormal findings: Secondary | ICD-10-CM

## 2024-05-15 DIAGNOSIS — Z1321 Encounter for screening for nutritional disorder: Secondary | ICD-10-CM

## 2024-05-15 DIAGNOSIS — N62 Hypertrophy of breast: Secondary | ICD-10-CM | POA: Diagnosis not present

## 2024-05-15 DIAGNOSIS — Z13228 Encounter for screening for other metabolic disorders: Secondary | ICD-10-CM

## 2024-05-15 DIAGNOSIS — G4700 Insomnia, unspecified: Secondary | ICD-10-CM | POA: Insufficient documentation

## 2024-05-15 DIAGNOSIS — N951 Menopausal and female climacteric states: Secondary | ICD-10-CM | POA: Insufficient documentation

## 2024-05-15 DIAGNOSIS — Z113 Encounter for screening for infections with a predominantly sexual mode of transmission: Secondary | ICD-10-CM | POA: Insufficient documentation

## 2024-05-15 DIAGNOSIS — E559 Vitamin D deficiency, unspecified: Secondary | ICD-10-CM

## 2024-05-15 DIAGNOSIS — R0981 Nasal congestion: Secondary | ICD-10-CM | POA: Insufficient documentation

## 2024-05-15 DIAGNOSIS — L508 Other urticaria: Secondary | ICD-10-CM | POA: Insufficient documentation

## 2024-05-15 NOTE — Assessment & Plan Note (Addendum)
 Annual exam as documented.  Counseling done include healthy lifestyle involving committing to 150 minutes of exercise per week, heart healthy diet, and attaining healthy weight. The importance of adequate sleep also discussed.   Changes in health habits are decided on by patient with goals and time frames set for achieving them. Immunization and cancer screening  needs are specifically addressed at this visit.    Up-to-date with mammogram, colon cancer screening cervical cancer screening Stating that she does not get flu vaccine

## 2024-05-15 NOTE — Assessment & Plan Note (Addendum)
 Variable menstrual cycles attributed to perimenopausal changes. Previous ultrasound showed no fibroids.

## 2024-05-15 NOTE — Assessment & Plan Note (Signed)
 Last vitamin D  Lab Results  Component Value Date   VD25OH 29.0 (L) 12/19/2022  Taking OTC vitamin D  supplement Rechecking labs

## 2024-05-15 NOTE — Assessment & Plan Note (Addendum)
 Wt Readings from Last 3 Encounters:  05/15/24 210 lb (95.3 kg)  07/11/23 201 lb (91.2 kg)  06/06/23 202 lb (91.6 kg)   Obesity, class 2 Weight increased from 201 lbs to 210 lbs. Prefers natural weight loss methods. Sedentary due to occupation. - Refer to medical weight management clinic. - Advise dietary changes with 70-80% vegetables and protein, less carbohydrates. - Encourage moderate to vigorous exercise 30 minutes, 5 days a week. - Refer to plastic surgery for breast reduction consultation.

## 2024-05-15 NOTE — Assessment & Plan Note (Signed)
.   Sleeps 5-6 hours on average. Discussed sleep hygiene and non-pharmacological interventions. - Advise on sleep hygiene: avoid daytime naps, keep room cool and dark, limit phone and TV use before bed. - Consider melatonin if interested in medication.

## 2024-05-15 NOTE — Assessment & Plan Note (Signed)
  Chronic nasal congestion without swelling of nasal passages. - Recommend Flonase nasal spray as needed.

## 2024-05-15 NOTE — Progress Notes (Signed)
 Complete physical exam  Patient: Valerie Rivera   DOB: Sep 02, 1976   48 y.o. Female  MRN: [id number]  Subjective:    Chief Complaint  Patient presents with   Annual Exam     Discussed the use of AI scribe software for clinical note transcription with the patient, who gave verbal consent to proceed.  History of Present Illness Valerie Rivera is a 48 year old female  has a past medical history of Allergy (01/2018), Blood transfusion without reported diagnosis (12/29/2001), Hives, Obesity, and Vitamin D  deficiency. who presents for an annual physical exam.  She has a history of chronic urticaria, with hives triggered by stress, pressure on her skin, or certain foods. She manages her symptoms with Allegra as needed.  She has not been consistent with her vitamin D  supplementation, last taking it about a month ago. She usually takes vitamin D  gummies but is unsure of the dosage.  She has experienced weight gain over the past year, increasing from 201 pounds in October to 210 pounds in January. 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  She experiences insomnia, often sleeping only five to six hours per night. She attributes this to perimenopause and her habit of drinking a lot of water before bed, which causes her to wake up to use the bathroom. She occasionally uses natural sounds to aid sleep.  She reports irregular menstrual cycles, with variability in flow, which she attributes to perimenopause. An ultrasound last year showed no fibroids.  She reports persistent nasal congestion, feeling like her nostrils are never completely clear, though she does not experience sneezing or a runny nose. She uses eucalyptus to help clear her nasal passages.  She is concerned about her weight due to a family history of heart disease and diabetes. Her birth mother had a pacemaker, and her brother has  heart issues.    Most recent fall risk assessment:    06/06/2023    1:08 PM  Fall Risk   Falls in the past year? 0  Number falls in past yr: 0  Injury with Fall? 0     Most recent depression screenings:    05/15/2024   11:05 AM 06/06/2023    1:08 PM  PHQ 2/9 Scores  PHQ - 2 Score 0 0  PHQ- 9 Score 0 0        Patient Care Team: Naudia Crosley R, FNP as PCP - General (Nurse Practitioner)   Outpatient Medications Prior to Visit  Medication Sig   fexofenadine (ALLEGRA) 60 MG tablet Take 60 mg by mouth 2 (two) times daily.   VITAMIN D  PO Take by mouth.   famotidine (PEPCID) 10 MG tablet Take 10 mg by mouth 2 (two) times daily. (Patient not taking: Reported on 05/15/2024)   ibuprofen  (ADVIL ) 800 MG tablet Take 1 tablet (800 mg total) by mouth every 8 (eight) hours as needed (pain).   [DISCONTINUED] Vitamin D , Ergocalciferol , (DRISDOL ) 1.25 MG (50000 UNIT) CAPS capsule Take 1 capsule (50,000 Units total) by mouth every 7 (seven) days. (Patient not taking: Reported on 07/11/2023)   No facility-administered medications prior to visit.    Review of Systems  Constitutional:  Negative for appetite change, chills, fatigue and fever.  HENT:  Negative for congestion, postnasal drip, rhinorrhea and sneezing.   Eyes:  Negative for pain, discharge and itching.  Respiratory:  Negative for cough, shortness  of breath and wheezing.   Cardiovascular:  Negative for chest pain, palpitations and leg swelling.  Gastrointestinal:  Negative for abdominal pain, constipation, nausea and vomiting.  Endocrine: Negative for polyphagia and polyuria.  Genitourinary:  Negative for difficulty urinating, dysuria, flank pain and frequency.  Musculoskeletal:  Negative for arthralgias, back pain, joint swelling and myalgias.  Skin:  Negative for color change, pallor, rash and wound.  Allergic/Immunologic: Negative for immunocompromised state.  Neurological:  Negative for dizziness, facial asymmetry,  weakness, numbness and headaches.  Psychiatric/Behavioral:  Negative for behavioral problems, confusion, self-injury and suicidal ideas.        Objective:     BP 129/65   Pulse 81   Wt 210 lb (95.3 kg)   SpO2 100%   BMI 37.20 kg/m    Physical Exam Vitals and nursing note reviewed.  Constitutional:      General: She is not in acute distress.    Appearance: Normal appearance. She is obese. She is not ill-appearing, toxic-appearing or diaphoretic.  HENT:     Right Ear: Tympanic membrane, ear canal and external ear normal. There is no impacted cerumen.     Left Ear: Tympanic membrane, ear canal and external ear normal. There is no impacted cerumen.     Nose: Nose normal. No congestion or rhinorrhea.     Mouth/Throat:     Mouth: Mucous membranes are moist.     Pharynx: Oropharynx is clear. No oropharyngeal exudate or posterior oropharyngeal erythema.  Eyes:     General: No scleral icterus.       Right eye: No discharge.        Left eye: No discharge.     Extraocular Movements: Extraocular movements intact.     Conjunctiva/sclera: Conjunctivae normal.  Neck:     Vascular: No carotid bruit.  Cardiovascular:     Rate and Rhythm: Normal rate and regular rhythm.     Pulses: Normal pulses.     Heart sounds: Normal heart sounds. No murmur heard.    No friction rub. No gallop.  Pulmonary:     Effort: Pulmonary effort is normal. No respiratory distress.     Breath sounds: Normal breath sounds. No stridor. No wheezing, rhonchi or rales.  Chest:     Chest wall: No tenderness.  Abdominal:     General: Bowel sounds are normal. There is no distension.     Palpations: Abdomen is soft. There is no mass.     Tenderness: There is no abdominal tenderness. There is no right CVA tenderness, left CVA tenderness, guarding or rebound.     Hernia: No hernia is present.  Musculoskeletal:        General: No swelling, tenderness, deformity or signs of injury.     Cervical back: Normal range of  motion and neck supple. No rigidity or tenderness.     Right lower leg: No edema.     Left lower leg: No edema.  Lymphadenopathy:     Cervical: No cervical adenopathy.  Skin:    General: Skin is warm and dry.     Capillary Refill: Capillary refill takes less than 2 seconds.     Coloration: Skin is not jaundiced or pale.     Findings: No bruising, erythema, lesion or rash.  Neurological:     Mental Status: She is alert and oriented to person, place, and time.     Cranial Nerves: No cranial nerve deficit.     Sensory: No sensory deficit.  Motor: No weakness.     Coordination: Coordination normal.     Gait: Gait normal.     Deep Tendon Reflexes: Reflexes normal.  Psychiatric:        Mood and Affect: Mood normal.        Behavior: Behavior normal.        Thought Content: Thought content normal.        Judgment: Judgment normal.     No results found for any visits on 05/15/24.     Assessment & Plan:    Assessment and Plan Assessment & Plan    Routine Health Maintenance and Physical Exam  Immunization History  Administered Date(s) Administered   Tdap 12/16/2009, 12/27/2021    Health Maintenance  Topic Date Due   Hepatitis B Vaccines 19-59 Average Risk (1 of 3 - 19+ 3-dose series) Never done   COVID-19 Vaccine (1 - 2024-25 season) Never done   INFLUENZA VACCINE  04/26/2024   Fecal DNA (Cologuard)  10/10/2025   Cervical Cancer Screening (HPV/Pap Cotest)  09/17/2027   DTaP/Tdap/Td (3 - Td or Tdap) 12/28/2031   Hepatitis C Screening  Completed   Pneumococcal Vaccine  Aged Out   HPV VACCINES  Aged Out   Meningococcal B Vaccine  Aged Out   HIV Screening  Discontinued    Discussed health benefits of physical activity, and encouraged her to engage in regular exercise appropriate for her age and condition.  Problem List Items Addressed This Visit       Musculoskeletal and Integument   Chronic urticaria     Chronic hives triggered by pressure, certain foods, and  stress. Managed with Allegra 60 mg as needed.        Other   Class 2 severe obesity due to excess calories with serious comorbidity in adult [facility])   Wt Readings from Last 3 Encounters:  05/15/24 210 lb (95.3 kg)  07/11/23 201 lb (91.2 kg)  06/06/23 202 lb (91.6 kg)   Obesity, class 2 Weight increased from 201 lbs to 210 lbs. Prefers natural weight loss methods. Sedentary due to occupation. - Refer to medical weight management clinic. - Advise dietary changes with 70-80% vegetables and protein, less carbohydrates. - Encourage moderate to vigorous exercise 30 minutes, 5 days a week. - Refer to plastic surgery for breast reduction consultation.      Relevant Orders   Amb Ref to Medical Weight Management   Annual physical exam - Primary   Annual exam as documented.  Counseling done include healthy lifestyle involving committing to 150 minutes of exercise per week, heart healthy diet, and attaining healthy weight. The importance of adequate sleep also discussed.   Changes in health habits are decided on by patient with goals and time frames set for achieving them. Immunization and cancer screening  needs are specifically addressed at this visit.    Up-to-date with mammogram, colon cancer screening cervical cancer screening Stating that she does not get flu vaccine      Vitamin D  deficiency   Last vitamin D  Lab Results  Component Value Date   VD25OH 29.0 (L) 12/19/2022  Taking OTC vitamin D  supplement Rechecking labs      Relevant Orders   VITAMIN D  25 Hydroxy (Vit-D Deficiency, Fractures)   Macromastia   Considering breast reduction for back and shoulder issues. Referred to plastic surgery       Relevant Orders   Ambulatory referral to General Surgery   Ambulatory referral to Plastic Surgery   Perimenopausal  Variable menstrual cycles attributed to perimenopausal changes. Previous ultrasound showed no fibroids.       Insomnia    . Sleeps 5-6 hours on average.  Discussed sleep hygiene and non-pharmacological interventions. - Advise on sleep hygiene: avoid daytime naps, keep room cool and dark, limit phone and TV use before bed. - Consider melatonin if interested in medication.      Nasal congestion    Chronic nasal congestion without swelling of nasal passages. - Recommend Flonase nasal spray as needed.      Anemia   Relevant Orders   CBC   Vitamin B12   Iron, TIBC and Ferritin Panel   Screen for STD (sexually transmitted disease)   Relevant Orders   HepB+HepC+HIV Panel   Chlamydia/Gonococcus/Trichomonas, NAA   RPR   Other Visit Diagnoses       Screening for endocrine, nutritional, metabolic and immunity disorder       Relevant Orders   Lipid panel   CMP14+EGFR      Return in about 1 year (around 05/15/2025) for CPE.     Quinita Kostelecky R Lashae Wollenberg, FNP

## 2024-05-15 NOTE — Assessment & Plan Note (Signed)
   Chronic hives triggered by pressure, certain foods, and stress. Managed with Allegra 60 mg as needed.

## 2024-05-15 NOTE — Patient Instructions (Addendum)
 Please maintain simple sleep hygiene. - Maintain dark and non-noisy environment in the bedroom. - Please use the bedroom for sleep and sexual activity only. - Do not use electronic devices in the bedroom. - Please take dinner at least 2 hours before bedtime. - Please avoid caffeinated products in the evening, including coffee, soft drinks. - Please try to maintain the regular sleep-wake cycle - Go to bed and wake up at the same time.   1. Class 2 severe obesity due to excess calories with serious comorbidity and body mass index (BMI) of 37.0 to 37.9 in adult (HCC) (Primary)  - Amb Ref to Medical Weight Management  2. Macromastia  - Ambulatory referral to General Surgery  3. Anemia, unspecified type  - CBC - Vitamin B12 - Iron, TIBC and Ferritin Panel  4. Vitamin D  deficiency  - VITAMIN D  25 Hydroxy (Vit-D Deficiency, Fractures)  5. Screening for endocrine, nutritional, metabolic and immunity disorder  - Lipid panel - CMP14+EGFR  6. Screen for STD (sexually transmitted disease)  - HepB+HepC+HIV Panel - Chlamydia/Gonococcus/Trichomonas, NAA - RPR     Please call the medical weight management clinic on [phone] to schedule an appointment   It is important that you exercise regularly at least 30 minutes 5 times a week as tolerated  Think about what you will eat, plan ahead. Choose  clean, green, fresh or frozen over canned, processed or packaged foods which are more sugary, salty and fatty. 70 to 75% of food eaten should be vegetables and fruit. Three meals at set times with snacks allowed between meals, but they must be fruit or vegetables. Aim to eat over a 12 hour period , example 7 am to 7 pm, and STOP after  your last meal of the day. Drink water,generally about 64 ounces per day, no other drink is as healthy. Fruit juice is best enjoyed in a healthy way, by EATING the fruit.  Thanks for choosing [facility] we consider it a privelige to serve you.

## 2024-05-15 NOTE — Assessment & Plan Note (Addendum)
 Considering breast reduction for back and shoulder issues. Referred to plastic surgery

## 2024-05-16 NOTE — Addendum Note (Signed)
 Addended by: JUANICE THOMES SAUNDERS on: 05/16/2024 12:17 AM   Modules accepted: Orders

## 2024-05-24 ENCOUNTER — Other Ambulatory Visit

## 2024-05-24 DIAGNOSIS — E559 Vitamin D deficiency, unspecified: Secondary | ICD-10-CM

## 2024-05-24 DIAGNOSIS — Z0000 Encounter for general adult medical examination without abnormal findings: Secondary | ICD-10-CM

## 2024-05-24 DIAGNOSIS — Z13220 Encounter for screening for lipoid disorders: Secondary | ICD-10-CM

## 2024-05-24 DIAGNOSIS — Z1321 Encounter for screening for nutritional disorder: Secondary | ICD-10-CM

## 2024-05-24 DIAGNOSIS — Z113 Encounter for screening for infections with a predominantly sexual mode of transmission: Secondary | ICD-10-CM

## 2024-05-24 DIAGNOSIS — D649 Anemia, unspecified: Secondary | ICD-10-CM

## 2024-05-25 LAB — CBC
Hematocrit: 32.7 % — ABNORMAL LOW (ref 34.0–46.6)
Hemoglobin: 9.1 g/dL — ABNORMAL LOW (ref 11.1–15.9)
MCH: 21.6 pg — ABNORMAL LOW (ref 26.6–33.0)
MCHC: 27.8 g/dL — ABNORMAL LOW (ref 31.5–35.7)
MCV: 78 fL — ABNORMAL LOW (ref 79–97)
Platelets: 449 x10E3/uL (ref 150–450)
RBC: 4.22 x10E6/uL (ref 3.77–5.28)
RDW: 16.3 % — ABNORMAL HIGH (ref 11.7–15.4)
WBC: 9.1 x10E3/uL (ref 3.4–10.8)

## 2024-05-25 LAB — CMP14+EGFR
ALT: 20 IU/L (ref 0–32)
AST: 22 IU/L (ref 0–40)
Albumin: 4.2 g/dL (ref 3.9–4.9)
Alkaline Phosphatase: 102 IU/L (ref 44–121)
BUN/Creatinine Ratio: 11 (ref 9–23)
BUN: 9 mg/dL (ref 6–24)
Bilirubin Total: 0.3 mg/dL (ref 0.0–1.2)
CO2: 20 mmol/L (ref 20–29)
Calcium: 9 mg/dL (ref 8.7–10.2)
Chloride: 104 mmol/L (ref 96–106)
Creatinine, Ser: 0.8 mg/dL (ref 0.57–1.00)
Globulin, Total: 3.5 g/dL (ref 1.5–4.5)
Glucose: 87 mg/dL (ref 70–99)
Potassium: 4.3 mmol/L (ref 3.5–5.2)
Sodium: 138 mmol/L (ref 134–144)
Total Protein: 7.7 g/dL (ref 6.0–8.5)
eGFR: 91 mL/min/1.73 (ref >59)

## 2024-05-25 LAB — IRON,TIBC AND FERRITIN PANEL
Ferritin: 12 ng/mL — ABNORMAL LOW (ref 15–150)
Iron Saturation: 4 % — CL (ref 15–55)
Iron: 20 ug/dL — ABNORMAL LOW (ref 27–159)
Total Iron Binding Capacity: 456 ug/dL — ABNORMAL HIGH (ref 250–450)
UIBC: 436 ug/dL — ABNORMAL HIGH (ref 131–425)

## 2024-05-25 LAB — RPR: RPR Ser Ql: NONREACTIVE

## 2024-05-25 LAB — HEPB+HEPC+HIV PANEL
HIV Screen 4th Generation wRfx: NONREACTIVE
Hep B C IgM: NEGATIVE
Hep B Core Total Ab: NEGATIVE
Hep B E Ab: NONREACTIVE
Hep B E Ag: NEGATIVE
Hep B Surface Ab, Qual: REACTIVE
Hep C Virus Ab: NONREACTIVE
Hepatitis B Surface Ag: NEGATIVE

## 2024-05-25 LAB — VITAMIN B12: Vitamin B-12: 340 pg/mL (ref 232–1245)

## 2024-05-25 LAB — VITAMIN D 25 HYDROXY (VIT D DEFICIENCY, FRACTURES): Vit D, 25-Hydroxy: 29.2 ng/mL — ABNORMAL LOW (ref 30.0–100.0)

## 2024-05-27 LAB — CHLAMYDIA/GC NAA, CONFIRMATION
Chlamydia trachomatis, NAA: NEGATIVE
Neisseria gonorrhoeae, NAA: NEGATIVE

## 2024-05-28 ENCOUNTER — Ambulatory Visit: Payer: Self-pay | Admitting: Nurse Practitioner

## 2024-05-28 DIAGNOSIS — D509 Iron deficiency anemia, unspecified: Secondary | ICD-10-CM

## 2024-05-28 MED ORDER — IRON (FERROUS SULFATE) 325 (65 FE) MG PO TABS
325.0000 mg | ORAL_TABLET | Freq: Every day | ORAL | 3 refills | Status: DC
Start: 2024-05-28 — End: 2024-06-18

## 2024-05-30 LAB — LIPID PANEL
Chol/HDL Ratio: 4.5 ratio — ABNORMAL HIGH (ref 0.0–4.4)
Cholesterol, Total: 147 mg/dL (ref 100–199)
HDL: 33 mg/dL — ABNORMAL LOW (ref >39)
LDL Chol Calc (NIH): 99 mg/dL (ref 0–99)
Triglycerides: 76 mg/dL (ref 0–149)
VLDL Cholesterol Cal: 15 mg/dL (ref 5–40)

## 2024-05-30 LAB — SPECIMEN STATUS REPORT

## 2024-06-18 ENCOUNTER — Other Ambulatory Visit: Payer: Self-pay | Admitting: Nurse Practitioner

## 2024-06-18 DIAGNOSIS — D509 Iron deficiency anemia, unspecified: Secondary | ICD-10-CM

## 2024-06-18 MED ORDER — FERROUS SULFATE ER 142 (45 FE) MG PO TBCR: 1.0000 | EXTENDED_RELEASE_TABLET | Freq: Every day | ORAL | 2 refills | Status: AC

## 2024-08-27 ENCOUNTER — Encounter: Payer: Self-pay | Admitting: Nurse Practitioner

## 2024-08-27 ENCOUNTER — Ambulatory Visit (INDEPENDENT_AMBULATORY_CARE_PROVIDER_SITE_OTHER): Admitting: Nurse Practitioner

## 2024-08-27 VITALS — BP 143/83 | HR 87 | Wt 211.6 lb

## 2024-08-27 DIAGNOSIS — I10 Essential (primary) hypertension: Secondary | ICD-10-CM

## 2024-08-27 DIAGNOSIS — D509 Iron deficiency anemia, unspecified: Secondary | ICD-10-CM

## 2024-08-27 DIAGNOSIS — N939 Abnormal uterine and vaginal bleeding, unspecified: Secondary | ICD-10-CM

## 2024-08-27 NOTE — Patient Instructions (Addendum)
 Around 3 times per week, check your blood pressure 2 times per day. once in the morning and once in the evening. The readings should be at least one minute apart. Write down these values and bring them to your next nurse visit/appointment.  When you check your BP, make sure you have been doing something calm/relaxing 5 minutes prior to checking. Both feet should be flat on the floor and you should be sitting. Use your left arm and make sure it is in a relaxed position (on a table), and that the cuff is at the approximate level/height of your heart.  Please call the office if your blood pressure consistently stays greater than 140/90  Okay to take iron  pill with a stool softener like Colace, also take with vitamin C to help improve absorption  Please get your ultrasound done as discussed and follow-up with gynecologist   It is important that you exercise regularly at least 30 minutes 5 times a week as tolerated  Think about what you will eat, plan ahead. Choose  clean, green, fresh or frozen over canned, processed or packaged foods which are more sugary, salty and fatty. 70 to 75% of food eaten should be vegetables and fruit. Three meals at set times with snacks allowed between meals, but they must be fruit or vegetables. Aim to eat over a 12 hour period , example 7 am to 7 pm, and STOP after  your last meal of the day. Drink water,generally about 64 ounces per day, no other drink is as healthy. Fruit juice is best enjoyed in a healthy way, by EATING the fruit.  Thanks for choosing [facility] we consider it a privelige to serve you.

## 2024-08-27 NOTE — Assessment & Plan Note (Addendum)
 Wt Readings from Last 3 Encounters:  08/27/24 211 lb 9.6 oz (96 kg)  05/15/24 210 lb (95.3 kg)  07/11/23 201 lb (91.2 kg)   Body mass index is 37.48 kg/m.  Associated with hypertension Patient counseled on low-carb diet Encouraged regular moderate to vigorous exercises at least [place] 50 minutes weekly as tolerated

## 2024-08-27 NOTE — Progress Notes (Signed)
 Established Patient Office Visit  Subjective:  Patient ID: Valerie Rivera, female    DOB: 1976/07/25  Age: 48 y.o. MRN: [id number]  CC:  Chief Complaint  Patient presents with   Menorrhagia    Would like to have blood work to see why the bleeding is so heavy.    Labs Only    Would like to have testing for hormonal imbalance, and iron      HPI   Discussed the use of AI scribe software for clinical note transcription with the patient, who gave verbal consent to proceed.  History of Present Illness Valerie Rivera is a 48 year old female  has a past medical history of Allergy (01/2018), Blood transfusion without reported diagnosis (12/29/2001), Hives, Obesity, and Vitamin D  deficiency.  who presents with heavy menstrual bleeding and concerns about anemia.  She has been experiencing heavy menstrual bleeding for three consecutive days, requiring changing of tampons and pads every one to two hours. This episode began on November 25th and lasted until November 28th. Prior to this, she had a 14-day menstrual cycle with initial spotting for three to four days, followed by normal flow, and then heavy bleeding at the end. Her menstrual cycles have been irregular over the past year or two, with changes in flow and duration.  She has a history of anemia and has been taking iron  supplements. Initially, she took the iron  as prescribed but experienced constipation and nausea, leading her to adjust the frequency to every other day or every two days. Recently, due to the heavy bleeding, she resumed taking the iron  daily. She experiences fatigue and had a dizzy spell while sitting in bed last week. No chest pain or shortness of breath, but she notes some cramping during her menstrual cycle, which is unusual for her.  Her blood pressure readings have been slightly elevated, with recent measurements of 142/83  She attributes this to stress and dietary changes during Thanksgiving. She has a home blood  pressure monitor but has not been using it regularly.  She has not seen a gynecologist since her last pregnancy and is considering a referral to a gynecologist in [facility], where she was previously seen.    Assessment & Plan       Past Medical History:  Diagnosis Date   Allergy 01/2018   Chronic Hives   Blood transfusion without reported diagnosis 12/29/2001   Hives    Obesity    Vitamin D  deficiency     Past Surgical History:  Procedure Laterality Date   CESAREAN SECTION  12/29/2001, 12/16/2009    Family History  Adopted: Yes  Problem Relation Age of Onset   Heart disease Mother    Asthma Son    Heart disease Maternal Uncle    Diabetes Mellitus II Paternal Grandmother    Breast cancer Neg Hx     Social History   Socioeconomic History   Marital status: Single    Spouse name: Not on file   Number of children: 2   Years of education: Not on file   Highest education level: Bachelor's degree (e.g., BA, AB, BS)  Occupational History   Not on file  Tobacco Use   Smoking status: Never   Smokeless tobacco: Never  Vaping Use   Vaping status: Never Used  Substance and Sexual Activity   Alcohol use: Yes    Comment: occassionally   Drug use: Never   Sexual activity: Yes    Birth control/protection: Condom  Other  Topics Concern   Not on file  Social History Narrative   Lives with her daughter   Social Drivers of Health   Financial Resource Strain: Low Risk  (05/15/2024)   Overall Financial Resource Strain (CARDIA)    Difficulty of Paying Living Expenses: Not hard at all  Food Insecurity: No Food Insecurity (05/15/2024)   Hunger Vital Sign    Worried About Running Out of Food in the Last Year: Never true    Ran Out of Food in the Last Year: Never true  Transportation Needs: No Transportation Needs (05/15/2024)   PRAPARE - Administrator, Civil Service (Medical): No    Lack of Transportation (Non-Medical): No  Physical Activity: Insufficiently  Active (05/15/2024)   Exercise Vital Sign    Days of Exercise per Week: 2 days    Minutes of Exercise per Session: 30 min  Stress: No Stress Concern Present (05/15/2024)   [employer] of Occupational Health - Occupational Stress Questionnaire    Feeling of Stress: Only a little  Social Connections: Moderately Isolated (05/15/2024)   Social Connection and Isolation Panel    Frequency of Communication with Friends and Family: More than three times a week    Frequency of Social Gatherings with Friends and Family: Once a week    Attends Religious Services: 1 to 4 times per year    Active Member of [employer] or Organizations: No    Attends Engineer, Structural: Not on file    Marital Status: Never married  Intimate Partner Violence: Not At Risk (12/19/2022)   Humiliation, Afraid, Rape, and Kick questionnaire    Fear of Current or Ex-Partner: No    Emotionally Abused: No    Physically Abused: No    Sexually Abused: No    Outpatient Medications Prior to Visit  Medication Sig Dispense Refill   ferrous sulfate  ER 142 (45 Fe) MG TBCR tablet Take 1 tablet (45 mg of iron  total) by mouth daily. 30 tablet 2   fexofenadine (ALLEGRA) 60 MG tablet Take 60 mg by mouth 2 (two) times daily.     VITAMIN D  PO Take by mouth.     famotidine (PEPCID) 10 MG tablet Take 10 mg by mouth 2 (two) times daily. (Patient not taking: Reported on 08/27/2024)     ibuprofen  (ADVIL ) 800 MG tablet Take 1 tablet (800 mg total) by mouth every 8 (eight) hours as needed (pain). (Patient not taking: Reported on 08/27/2024) 21 tablet 0   No facility-administered medications prior to visit.    No Known Allergies  ROS Review of Systems  Constitutional:  Positive for fatigue. Negative for appetite change, chills and fever.  HENT:  Negative for congestion, postnasal drip, rhinorrhea and sneezing.   Respiratory:  Negative for cough, shortness of breath and wheezing.   Cardiovascular:  Negative for chest pain,  palpitations and leg swelling.  Gastrointestinal:  Negative for abdominal pain, constipation, nausea and vomiting.  Genitourinary:  Negative for difficulty urinating, dysuria, flank pain and frequency.  Musculoskeletal:  Negative for arthralgias, back pain, joint swelling and myalgias.  Skin:  Negative for color change, pallor, rash and wound.  Neurological:  Positive for dizziness. Negative for facial asymmetry, weakness, numbness and headaches.  Psychiatric/Behavioral:  Negative for behavioral problems, confusion, self-injury and suicidal ideas.       Objective:    Physical Exam Vitals and nursing note reviewed.  Constitutional:      General: She is not in acute distress.    Appearance:  Normal appearance. She is obese. She is not ill-appearing, toxic-appearing or diaphoretic.  HENT:     Mouth/Throat:     Mouth: Mucous membranes are moist.     Pharynx: Oropharynx is clear. No oropharyngeal exudate or posterior oropharyngeal erythema.  Eyes:     General: No scleral icterus.       Right eye: No discharge.        Left eye: No discharge.     Extraocular Movements: Extraocular movements intact.     Conjunctiva/sclera: Conjunctivae normal.  Cardiovascular:     Rate and Rhythm: Normal rate and regular rhythm.     Pulses: Normal pulses.     Heart sounds: Normal heart sounds. No murmur heard.    No friction rub. No gallop.  Pulmonary:     Effort: Pulmonary effort is normal. No respiratory distress.     Breath sounds: Normal breath sounds. No stridor. No wheezing, rhonchi or rales.  Chest:     Chest wall: No tenderness.  Abdominal:     General: There is no distension.     Palpations: Abdomen is soft.     Tenderness: There is no abdominal tenderness. There is no right CVA tenderness, left CVA tenderness or guarding.  Musculoskeletal:        General: No swelling, tenderness, deformity or signs of injury.     Right lower leg: No edema.     Left lower leg: No edema.  Skin:     General: Skin is warm and dry.     Capillary Refill: Capillary refill takes less than 2 seconds.     Coloration: Skin is not jaundiced or pale.     Findings: No bruising, erythema or lesion.  Neurological:     Mental Status: She is alert and oriented to person, place, and time.     Motor: No weakness.     Gait: Gait normal.  Psychiatric:        Mood and Affect: Mood normal.        Behavior: Behavior normal.        Thought Content: Thought content normal.        Judgment: Judgment normal.     BP (!) 143/83   Pulse 87   Wt 211 lb 9.6 oz (96 kg)   SpO2 98%   BMI 37.48 kg/m  Wt Readings from Last 3 Encounters:  08/27/24 211 lb 9.6 oz (96 kg)  05/15/24 210 lb (95.3 kg)  07/11/23 201 lb (91.2 kg)    Lab Results  Component Value Date   TSH 1.110 09/16/2022   Lab Results  Component Value Date   WBC 9.1 05/24/2024   HGB 9.1 (L) 05/24/2024   HCT 32.7 (L) 05/24/2024   MCV 78 (L) 05/24/2024   PLT 449 05/24/2024   Lab Results  Component Value Date   NA 138 05/24/2024   K 4.3 05/24/2024   CO2 20 05/24/2024   GLUCOSE 87 05/24/2024   BUN 9 05/24/2024   CREATININE 0.80 05/24/2024   BILITOT 0.3 05/24/2024   ALKPHOS 102 05/24/2024   AST 22 05/24/2024   ALT 20 05/24/2024   PROT 7.7 05/24/2024   ALBUMIN 4.2 05/24/2024   CALCIUM 9.0 05/24/2024   EGFR 91 05/24/2024   Lab Results  Component Value Date   CHOL 147 05/24/2024   Lab Results  Component Value Date   HDL 33 (L) 05/24/2024   Lab Results  Component Value Date   LDLCALC 99 05/24/2024   Lab Results  Component Value Date   TRIG 76 05/24/2024   Lab Results  Component Value Date   CHOLHDL 4.5 (H) 05/24/2024   Lab Results  Component Value Date   HGBA1C 5.4 09/16/2022      Assessment & Plan:   Problem List Items Addressed This Visit       Cardiovascular and Mediastinum   Hypertension    Elevated readings possibly due to stress, recent bleeding, and diet. No antihypertensive medication currently. -  Encouraged 30 minutes of moderate to vigorous exercise five days a week. - Advised on a heart-healthy, low-salt diet. - Instructed to monitor blood pressure at home and report if consistently greater than 140/90 mmHg. - Scheduled follow-up in two months to reassess blood pressure.     08/27/2024    3:51 PM 08/27/2024    3:34 PM 08/27/2024    3:26 PM 05/15/2024   11:02 AM 07/11/2023    2:56 PM 06/26/2023    3:52 PM 06/06/2023    1:22 PM  BP/Weight  Systolic BP 143 147 142 129 131 143 130  Diastolic BP 83 75 80 65 77 85 67  Wt. (Lbs)   211.6 210 201    BMI   37.48 kg/m2 37.2 kg/m2 35.61 kg/m2               Genitourinary   Abnormal uterine bleeding   Abnormal uterine bleeding Heavy bleeding with clots and spotting, likely perimenopausal hormonal changes. Previous ultrasound negative for fibroids. - Referred to gynecologist in [facility] for further evaluation. - Ordered repeat pelvic ultrasound to assess for fibroids or other abnormalities.        Relevant Orders   US  Pelvic Complete With Transvaginal   Ambulatory referral to Gynecology     Other   Morbid obesity (HCC)   Wt Readings from Last 3 Encounters:  08/27/24 211 lb 9.6 oz (96 kg)  05/15/24 210 lb (95.3 kg)  07/11/23 201 lb (91.2 kg)   Body mass index is 37.48 kg/m.  Associated with hypertension Patient counseled on low-carb diet Encouraged regular moderate to vigorous exercises at least [place] 50 minutes weekly as tolerated      Anemia - Primary   Lab Results  Component Value Date   WBC 9.1 05/24/2024   HGB 9.1 (L) 05/24/2024   HCT 32.7 (L) 05/24/2024   MCV 78 (L) 05/24/2024   PLT 449 05/24/2024    Lab Results  Component Value Date   IRON  20 (L) 05/24/2024   TIBC 456 (H) 05/24/2024   FERRITIN 12 (L) 05/24/2024   Iron  deficiency anemia Constipation and nausea with current iron  supplement. Recent heavy bleeding may worsen anemia. Low vitamin D  level noted. - Switch to a different form of iron   supplement if current form is not tolerate but she would like tp continue current form  - Take iron  with vitamin C supplement to enhance absorption. - Consider taking iron  with a stool softener if constipation persists. - Monitor iron  levels and blood count every three months. - Consider iron  infusion if oral iron  is not tolerated.       Relevant Orders   CBC   Iron , TIBC and Ferritin Panel    No orders of the defined types were placed in this encounter.   Follow-up: Return in about 2 months (around 10/28/2024) for HTN anemia.    Clarance Bollard R Ahnika Hannibal, FNP

## 2024-08-27 NOTE — Assessment & Plan Note (Signed)
 Abnormal uterine bleeding Heavy bleeding with clots and spotting, likely perimenopausal hormonal changes. Previous ultrasound negative for fibroids. - Referred to gynecologist in [facility] for further evaluation. - Ordered repeat pelvic ultrasound to assess for fibroids or other abnormalities.

## 2024-08-27 NOTE — Assessment & Plan Note (Signed)
 Lab Results  Component Value Date   WBC 9.1 05/24/2024   HGB 9.1 (L) 05/24/2024   HCT 32.7 (L) 05/24/2024   MCV 78 (L) 05/24/2024   PLT 449 05/24/2024    Lab Results  Component Value Date   IRON  20 (L) 05/24/2024   TIBC 456 (H) 05/24/2024   FERRITIN 12 (L) 05/24/2024   Iron  deficiency anemia Constipation and nausea with current iron  supplement. Recent heavy bleeding may worsen anemia. Low vitamin D  level noted. - Switch to a different form of iron  supplement if current form is not tolerate but she would like tp continue current form  - Take iron  with vitamin C supplement to enhance absorption. - Consider taking iron  with a stool softener if constipation persists. - Monitor iron  levels and blood count every three months. - Consider iron  infusion if oral iron  is not tolerated.

## 2024-08-27 NOTE — Assessment & Plan Note (Addendum)
  Elevated readings possibly due to stress, recent bleeding, and diet. No antihypertensive medication currently. - Encouraged 30 minutes of moderate to vigorous exercise five days a week. - Advised on a heart-healthy, low-salt diet. - Instructed to monitor blood pressure at home and report if consistently greater than 140/90 mmHg. - Scheduled follow-up in two months to reassess blood pressure.     08/27/2024    3:51 PM 08/27/2024    3:34 PM 08/27/2024    3:26 PM 05/15/2024   11:02 AM 07/11/2023    2:56 PM 06/26/2023    3:52 PM 06/06/2023    1:22 PM  BP/Weight  Systolic BP 143 147 142 129 131 143 130  Diastolic BP 83 75 80 65 77 85 67  Wt. (Lbs)   211.6 210 201    BMI   37.48 kg/m2 37.2 kg/m2 35.61 kg/m2

## 2024-08-28 ENCOUNTER — Ambulatory Visit: Payer: Self-pay | Admitting: Nurse Practitioner

## 2024-08-28 ENCOUNTER — Other Ambulatory Visit: Payer: Self-pay | Admitting: Nurse Practitioner

## 2024-08-28 DIAGNOSIS — D509 Iron deficiency anemia, unspecified: Secondary | ICD-10-CM

## 2024-08-28 LAB — CBC
Hematocrit: 30.1 % — ABNORMAL LOW (ref 34.0–46.6)
Hemoglobin: 9.6 g/dL — ABNORMAL LOW (ref 11.1–15.9)
MCH: 27 pg (ref 26.6–33.0)
MCHC: 31.9 g/dL (ref 31.5–35.7)
MCV: 85 fL (ref 79–97)
Platelets: 411 x10E3/uL (ref 150–450)
RBC: 3.56 x10E6/uL — ABNORMAL LOW (ref 3.77–5.28)
RDW: 17.8 % — ABNORMAL HIGH (ref 11.7–15.4)
WBC: 8.6 x10E3/uL (ref 3.4–10.8)

## 2024-08-28 LAB — IRON,TIBC AND FERRITIN PANEL
Ferritin: 29 ng/mL (ref 15–150)
Iron Saturation: 37 % (ref 15–55)
Iron: 138 ug/dL (ref 27–159)
Total Iron Binding Capacity: 373 ug/dL (ref 250–450)
UIBC: 235 ug/dL (ref 131–425)

## 2024-09-09 ENCOUNTER — Inpatient Hospital Stay: Admission: RE | Admit: 2024-09-09 | Discharge: 2024-09-09 | Attending: Nurse Practitioner

## 2024-09-09 DIAGNOSIS — N939 Abnormal uterine and vaginal bleeding, unspecified: Secondary | ICD-10-CM

## 2024-09-10 ENCOUNTER — Ambulatory Visit: Admitting: Nurse Practitioner

## 2024-09-24 ENCOUNTER — Ambulatory Visit: Admitting: Nurse Practitioner

## 2024-09-24 ENCOUNTER — Encounter: Payer: Self-pay | Admitting: Nurse Practitioner

## 2024-09-24 VITALS — BP 122/82 | HR 100

## 2024-09-24 DIAGNOSIS — D500 Iron deficiency anemia secondary to blood loss (chronic): Secondary | ICD-10-CM

## 2024-09-24 DIAGNOSIS — N926 Irregular menstruation, unspecified: Secondary | ICD-10-CM

## 2024-09-24 NOTE — Progress Notes (Signed)
" ° °  Acute Office Visit  Subjective:    Patient ID: Valerie Rivera, female    DOB: Mar 10, 1976, 48 y.o.   MRN: [id number]   HPI 48 y.o. G2P2 presents today for menstrual changes. Menses are monthly but can vary in flow. Some months bleeding is very heavy requiring changing of pads every 1-2 hours. Has not noticed perimenopausal symptoms. Sometimes she is warmer at night but not significantly. Normal US  09/09/24 - 13.8 mm lining, unable to see left ovary d/t gas. H/O anemia likely s/t heavy periods. Taking oral iron  but has not been consistent with it. Normal iron  panel 12/2, CBC 9.6. Started working out 4 weeks ago after having elevated BP at PCP visit. Feels better and menses were better this month.    Patient's last menstrual period was 09/18/2024 (exact date). Period Cycle (Days): 28 Period Duration (Days): 7-14 Period Pattern: Regular Menstrual Flow: Heavy, Moderate, Light (Varies) Menstrual Control: Maxi pad, Tampon Dysmenorrhea: (!) Mild Dysmenorrhea Symptoms: Cramping  Review of Systems  Constitutional: Negative.   Endocrine: Positive for heat intolerance.  Genitourinary:  Positive for menstrual problem.       Objective:    Physical Exam Constitutional:      Appearance: Normal appearance. She is obese.   GU: Not indicated  BP 122/82 (BP Location: Right Arm, Patient Position: Sitting)   Pulse 100   LMP 09/18/2024 (Exact Date)   SpO2 98%  Wt Readings from Last 3 Encounters:  08/27/24 211 lb 9.6 oz (96 kg)  05/15/24 210 lb (95.3 kg)  07/11/23 201 lb (91.2 kg)         Assessment & Plan:   Problem List Items Addressed This Visit       Other   Anemia   Other Visit Diagnoses       Menstrual changes    -  Primary   Relevant Orders   TSH      Plan: Reviewed normal ultrasound findings. TSH pending. Recommend being consistent with iron  supplement. Wants to monitor menses. Discussed management options with hormonal birth control or Lysteda. Also discussed  perimenopause and bleeding patterns that can occur.  I personally spent a total of 30 minutes in the care of the patient today including preparing to see the patient, getting/reviewing separately obtained history, counseling and educating, placing orders, documenting clinical information in the EHR, independently interpreting results, and communicating results.   Return if symptoms worsen or fail to improve.    Valerie DELENA Shutter DNP, 3:27 PM 09/24/2024 "

## 2024-09-25 ENCOUNTER — Ambulatory Visit: Payer: Self-pay | Admitting: Nurse Practitioner

## 2024-09-25 LAB — TSH: TSH: 2.11 m[IU]/L

## 2025-05-19 ENCOUNTER — Encounter: Payer: Self-pay | Admitting: Nurse Practitioner
